# Patient Record
Sex: Female | Born: 1990 | Race: Black or African American | Hispanic: No | Marital: Single | Smoking: Never smoker
Health system: Southern US, Community
[De-identification: ages and names within clinical notes are randomized; demographics above are authoritative.]

## PROBLEM LIST (undated history)

## (undated) ENCOUNTER — Inpatient Hospital Stay (HOSPITAL_COMMUNITY): Payer: Self-pay

## (undated) DIAGNOSIS — A549 Gonococcal infection, unspecified: Secondary | ICD-10-CM

## (undated) DIAGNOSIS — F419 Anxiety disorder, unspecified: Secondary | ICD-10-CM

## (undated) DIAGNOSIS — J45909 Unspecified asthma, uncomplicated: Secondary | ICD-10-CM

## (undated) HISTORY — PX: DILATION AND CURETTAGE OF UTERUS: SHX78

---

## 2016-10-27 ENCOUNTER — Inpatient Hospital Stay (HOSPITAL_COMMUNITY)
Admission: AD | Admit: 2016-10-27 | Discharge: 2016-10-27 | Disposition: A | Discharge status: Home / Self Care | Payer: Self-pay | Source: Ambulatory Visit | Attending: Obstetrics & Gynecology | Admitting: Obstetrics & Gynecology

## 2016-10-27 ENCOUNTER — Inpatient Hospital Stay (HOSPITAL_COMMUNITY): Payer: Self-pay

## 2016-10-27 ENCOUNTER — Encounter (HOSPITAL_COMMUNITY): Payer: Self-pay | Admitting: *Deleted

## 2016-10-27 DIAGNOSIS — Z3A13 13 weeks gestation of pregnancy: Secondary | ICD-10-CM | POA: Insufficient documentation

## 2016-10-27 DIAGNOSIS — O26891 Other specified pregnancy related conditions, first trimester: Secondary | ICD-10-CM | POA: Insufficient documentation

## 2016-10-27 DIAGNOSIS — O209 Hemorrhage in early pregnancy, unspecified: Secondary | ICD-10-CM | POA: Insufficient documentation

## 2016-10-27 DIAGNOSIS — O4692 Antepartum hemorrhage, unspecified, second trimester: Secondary | ICD-10-CM

## 2016-10-27 DIAGNOSIS — O3680X0 Pregnancy with inconclusive fetal viability, not applicable or unspecified: Secondary | ICD-10-CM

## 2016-10-27 DIAGNOSIS — Z6790 Unspecified blood type, Rh positive: Secondary | ICD-10-CM | POA: Insufficient documentation

## 2016-10-27 DIAGNOSIS — O4690 Antepartum hemorrhage, unspecified, unspecified trimester: Secondary | ICD-10-CM

## 2016-10-27 HISTORY — DX: Unspecified asthma, uncomplicated: J45.909

## 2016-10-27 LAB — URINALYSIS, ROUTINE W REFLEX MICROSCOPIC
BILIRUBIN URINE: NEGATIVE
GLUCOSE, UA: NEGATIVE mg/dL
HGB URINE DIPSTICK: NEGATIVE
Ketones, ur: 15 mg/dL — AB
Leukocytes, UA: NEGATIVE
Nitrite: NEGATIVE
PROTEIN: NEGATIVE mg/dL
Specific Gravity, Urine: 1.02 (ref 1.005–1.030)
pH: 6.5 (ref 5.0–8.0)

## 2016-10-27 LAB — CBC
HCT: 25.1 % — ABNORMAL LOW (ref 36.0–46.0)
Hemoglobin: 9.1 g/dL — ABNORMAL LOW (ref 12.0–15.0)
MCH: 32.3 pg (ref 26.0–34.0)
MCHC: 36.3 g/dL — ABNORMAL HIGH (ref 30.0–36.0)
MCV: 89 fL (ref 78.0–100.0)
PLATELETS: 191 10*3/uL (ref 150–400)
RBC: 2.82 MIL/uL — AB (ref 3.87–5.11)
RDW: 12.6 % (ref 11.5–15.5)
WBC: 13.5 10*3/uL — AB (ref 4.0–10.5)

## 2016-10-27 LAB — POCT PREGNANCY, URINE: PREG TEST UR: POSITIVE — AB

## 2016-10-27 LAB — HCG, QUANTITATIVE, PREGNANCY: HCG, BETA CHAIN, QUANT, S: 1851 m[IU]/mL — AB (ref <5)

## 2016-10-27 LAB — ABO/RH: ABO/RH(D): O POS

## 2016-10-27 MED ORDER — MISOPROSTOL 200 MCG PO TABS
600.0000 ug | ORAL_TABLET | Freq: Once | ORAL | Status: AC
  Administered 2016-10-27: 600 ug via BUCCAL
  Filled 2016-10-27: qty 3

## 2016-10-27 MED ORDER — HYDROMORPHONE HCL 1 MG/ML IJ SOLN
INTRAMUSCULAR | Status: AC
  Filled 2016-10-27: qty 1

## 2016-10-27 MED ORDER — LACTATED RINGERS IV BOLUS (SEPSIS)
1000.0000 mL | Freq: Once | INTRAVENOUS | Status: AC
  Administered 2016-10-27: 1000 mL via INTRAVENOUS

## 2016-10-27 MED ORDER — PROMETHAZINE HCL 25 MG/ML IJ SOLN
25.0000 mg | Freq: Once | INTRAMUSCULAR | Status: AC
  Administered 2016-10-27: 25 mg via INTRAVENOUS
  Filled 2016-10-27: qty 1

## 2016-10-27 MED ORDER — HYDROMORPHONE HCL 1 MG/ML IJ SOLN
2.0000 mg | Freq: Once | INTRAMUSCULAR | Status: AC
  Administered 2016-10-27: 2 mg via INTRAVENOUS
  Filled 2016-10-27: qty 2

## 2016-10-27 MED ORDER — PROMETHAZINE HCL 25 MG PO TABS: 25.0000 mg | ORAL_TABLET | Freq: Four times a day (QID) | ORAL | 0 refills | Status: DC | PRN

## 2016-10-27 MED ORDER — OXYCODONE-ACETAMINOPHEN 5-325 MG PO TABS
1.0000 | ORAL_TABLET | Freq: Four times a day (QID) | ORAL | 0 refills | Status: DC | PRN
Start: 2016-10-27 — End: 2017-03-15

## 2016-10-27 MED ORDER — HYDROMORPHONE HCL 2 MG/ML IJ SOLN
2.0000 mg | Freq: Once | INTRAMUSCULAR | Status: AC
  Administered 2016-10-27: 2 mg via INTRAVENOUS
  Filled 2016-10-27: qty 1

## 2016-10-27 NOTE — MAU Note (Signed)
Urine in lab 

## 2016-10-27 NOTE — MAU Provider Note (Signed)
History     CSN: [id number]  Arrival date and time: 10/27/16 0725       Chief Complaint  Patient presents with  . Vaginal Bleeding   [id number] @13 .6 wks by LMP here with severe abdominal cramping and heavy VB. She reports mild pain and spotting started last night then became heavier early this am. She was passing some clots at home. She denies seeing any passage of tissue. She did not take analgesics. [facility] and [facility].    Vaginal Bleeding  The patient's primary symptoms include pelvic pain and vaginal bleeding. This is a new problem. The current episode started today. The problem occurs constantly. The problem has been gradually worsening. The pain is severe. The problem affects both (10/10 ) sides. She is pregnant. Associated symptoms include abdominal pain. Pertinent negatives include no chills, constipation, diarrhea, dysuria, fever, frequency, nausea, urgency or vomiting. The vaginal discharge was bloody. The vaginal bleeding is heavier than menses. She has been passing clots. Passing tissue: unsure  Nothing aggravates the symptoms. She has tried nothing for the symptoms.   No past medical history on file.  No past surgical history on file.  No family history on file.  Social History  Substance Use Topics  . Smoking status: Not on file  . Smokeless tobacco: Not on file  . Alcohol use Not on file    Allergies:  Allergies  Allergen Reactions  . Aspirin Swelling    No prescriptions prior to admission.    Review of Systems  Constitutional: Negative for chills and fever.  Gastrointestinal: Positive for abdominal pain. Negative for constipation, diarrhea, nausea and vomiting.  Genitourinary: Positive for pelvic pain and vaginal bleeding. Negative for dysuria, frequency and urgency.   Physical Exam   There were no vitals taken for this visit.  Physical Exam  Nursing note and vitals reviewed. Constitutional: She is oriented  to person, place, and time. She appears well-developed and well-nourished. She appears distressed.  Cardiovascular: Normal rate.   Respiratory: Effort normal.  GI: Soft. There is tenderness.  Genitourinary:  Genitourinary Comments:  External: no lesion Vagina: large amount of blood and clots in the vagina. No tissue seen  Cervix: no tissue seen  Uterus: Patient unable to tolerate bimanual exam. Will re-examine after US and pain medication    Neurological: She is alert and oriented to person, place, and time.  Skin: Skin is warm and dry.   [place] Ob Comp Less 14 Wks  Result Date: 10/27/2016 CLINICAL DATA:  Severe pelvic pain and cramping and heavy bleeding beginning yesterday. Gestational age by LMP of 13 weeks 6 days. EXAM: OBSTETRIC <14 WK ULTRASOUND TECHNIQUE: Transabdominal ultrasound was performed for evaluation of the gestation as well as the maternal uterus and adnexal regions. Transvaginal sonography was declined by the ordering clinician in MAU. COMPARISON:  None. FINDINGS: No normal appearing intrauterine gestational sac identified within the endometrial cavity by transvaginal sonography. Endometrial thickness measures 22 mm, and complex heterogeneous fluid is seen within the inferior aspect of the endometrial cavity and endocervical canal, likely representing blood products. Both ovaries are normal in appearance. No adnexal mass or free fluid identified. IMPRESSION: No IUP visualized, with blood products within the endometrial cavity and endocervical canal, suspicious for recent spontaneous abortion. IUP too early to visualize and occult ectopic pregnancy remain in the differential diagnosis. Recommend continued followup of quantitative beta HCG levels, with follow-up ultrasound as clinically warranted. Electronically Signed   By:  Myles RosenthalJohn  Stahl M.D.   On: 10/27/2016 08:44   Results for orders placed or performed during the hospital encounter of 10/27/16 (from the past 24 hour(s))  Urinalysis,  Routine w reflex microscopic (not at [facility])     Status: Abnormal   Collection Time: 10/27/16  8:54 AM  Result Value Ref Range   Color, Urine YELLOW YELLOW   APPearance CLEAR CLEAR   Specific Gravity, Urine 1.020 1.005 - 1.030   pH 6.5 5.0 - 8.0   Glucose, UA NEGATIVE NEGATIVE mg/dL   Hgb urine dipstick NEGATIVE NEGATIVE   Bilirubin Urine NEGATIVE NEGATIVE   Ketones, ur 15 (A) NEGATIVE mg/dL   Protein, ur NEGATIVE NEGATIVE mg/dL   Nitrite NEGATIVE NEGATIVE   Leukocytes, UA NEGATIVE NEGATIVE  Pregnancy, urine POC     Status: Abnormal   Collection Time: 10/27/16  8:55 AM  Result Value Ref Range   Preg Test, Ur POSITIVE (A) NEGATIVE  CBC     Status: Abnormal   Collection Time: 10/27/16  9:01 AM  Result Value Ref Range   WBC 13.5 (H) 4.0 - 10.5 K/uL   RBC 2.82 (L) 3.87 - 5.11 MIL/uL   Hemoglobin 9.1 (L) 12.0 - 15.0 g/dL   HCT 96.025.1 (L) 45.436.0 - 09.846.0 %   MCV 89.0 78.0 - 100.0 fL   MCH 32.3 26.0 - 34.0 pg   MCHC 36.3 (H) 30.0 - 36.0 g/dL   RDW 11.912.6 14.711.5 - 82.915.5 %   Platelets 191 150 - 400 K/uL  hCG, quantitative, pregnancy     Status: Abnormal   Collection Time: 10/27/16  9:01 AM  Result Value Ref Range   hCG, Beta Chain, Quant, S 1,851 (H) <5 mIU/mL  ABO/Rh     Status: None (Preliminary result)   Collection Time: 10/27/16  9:01 AM  Result Value Ref Range   ABO/RH(D) O POS     MAU Course  Procedures Dilaudid 2 mg IV x2 doses Phenergan 25 mg IV x1  MDM CBC. HCG, ABO/RH, US pending  [facility] Loure[facility], CNM Tawnya CrookHogan, Heather Donovan 8:11 AM  10/27/16  0900--bleeding moderate with quarter sized clots, pain improved, Misoprostil ordered 1035-pt reports worsening pain, RN concerned about VB Speculum exam: small-mod amt of blood in vault with small clots, small amt of blood from os, pain meds given 1400-Pain and nausea improved, tolerating po, bleeding small. Stable for discharge home.  Likely first trimester SAB but cannot exclude ectopic therefore will have her  return in 2 days for rpt quant, if declining will follow weekly quants until negative. Discussed presentation, clinical findings, and plan with Dr. Alysia PennaErvin, agrees with plan.    Assessment and Plan   1. Pregnancy of unknown anatomic location   2. Vaginal bleeding in pregnancy   3. Blood type, Rh positive    Discharge home Follow up in MAU in 2 days for rpt quant Ectopic precautions  Donette LarryMelanie Hargis Vandyne, CNM  10/27/2016 2:35 PM

## 2016-10-27 NOTE — MAU Note (Signed)
Brought in by EMS with heavy bleeding with large clots and sever abdominal cramping; bleeding and cramping started last night and got progressively worse during the night;

## 2016-10-27 NOTE — Discharge Instructions (Signed)
Miscarriage  A miscarriage is the sudden loss of an unborn baby (fetus) before the 20th week of pregnancy. Most miscarriages happen in the first 3 months of pregnancy. Sometimes, it happens before a woman even knows she is pregnant. A miscarriage is also called a "spontaneous miscarriage" or "early pregnancy loss." Having a miscarriage can be an emotional experience. Talk with your caregiver about any questions you may have about miscarrying, the grieving process, and your future pregnancy plans.  CAUSES    Problems with the fetal chromosomes that make it impossible for the baby to develop normally. Problems with the baby's genes or chromosomes are most often the result of errors that occur, by chance, as the embryo divides and grows. The problems are not inherited from the parents.   Infection of the cervix or uterus.    Hormone problems.    Problems with the cervix, such as having an incompetent cervix. This is when the tissue in the cervix is not strong enough to hold the pregnancy.    Problems with the uterus, such as an abnormally shaped uterus, uterine fibroids, or congenital abnormalities.    Certain medical conditions.    Smoking, drinking alcohol, or taking illegal drugs.    Trauma.   Often, the cause of a miscarriage is unknown.   SYMPTOMS    Vaginal bleeding or spotting, with or without cramps or pain.   Pain or cramping in the abdomen or lower back.   Passing fluid, tissue, or blood clots from the vagina.  DIAGNOSIS   Your caregiver will perform a physical exam. You may also have an ultrasound to confirm the miscarriage. Blood or urine tests may also be ordered.  TREATMENT    Sometimes, treatment is not necessary if you naturally pass all the fetal tissue that was in the uterus. If some of the fetus or placenta remains in the body (incomplete miscarriage), tissue left behind may become infected and must be removed. Usually, a dilation and curettage (D and C) procedure is performed.  During a D and C procedure, the cervix is widened (dilated) and any remaining fetal or placental tissue is gently removed from the uterus.   Antibiotic medicines are prescribed if there is an infection. Other medicines may be given to reduce the size of the uterus (contract) if there is a lot of bleeding.   If you have Rh negative blood and your baby was Rh positive, you will need a Rh immunoglobulin shot. This shot will protect any future baby from having Rh blood problems in future pregnancies.  HOME CARE INSTRUCTIONS    Your caregiver may order bed rest or may allow you to continue light activity. Resume activity as directed by your caregiver.   Have someone help with home and family responsibilities during this time.    Keep track of the number of sanitary pads you use each day and how soaked (saturated) they are. Write down this information.    Do not use tampons. Do not douche or have sexual intercourse until approved by your caregiver.    Only take over-the-counter or prescription medicines for pain or discomfort as directed by your caregiver.    Do not take aspirin. Aspirin can cause bleeding.    Keep all follow-up appointments with your caregiver.    If you or your partner have problems with grieving, talk to your caregiver or seek counseling to help cope with the pregnancy loss. Allow enough time to grieve before trying to get pregnant again.     SEEK IMMEDIATE MEDICAL CARE IF:    You have severe cramps or pain in your back or abdomen.   You have a fever.   You pass large blood clots (walnut-sized or larger) ortissue from your vagina. Save any tissue for your caregiver to inspect.    Your bleeding increases.    You have a thick, bad-smelling vaginal discharge.   You become lightheaded, weak, or you faint.    You have chills.   MAKE SURE YOU:   Understand these instructions.   Will watch your condition.   Will get help right away if you are not doing well or get worse.     This  information is not intended to replace advice given to you by your health care provider. Make sure you discuss any questions you have with your health care provider.     Document Released: 06/07/2001 Document Revised: 04/08/2013 Document Reviewed: 01/31/2012  Elsevier Interactive Patient Education 2016 [employer]

## 2017-03-15 ENCOUNTER — Encounter (HOSPITAL_COMMUNITY): Payer: Self-pay | Admitting: *Deleted

## 2017-03-15 ENCOUNTER — Inpatient Hospital Stay (HOSPITAL_COMMUNITY)
Admission: AD | Admit: 2017-03-15 | Discharge: 2017-03-15 | Disposition: A | Discharge status: Home / Self Care | Payer: Self-pay | Source: Ambulatory Visit | Attending: Obstetrics and Gynecology | Admitting: Obstetrics and Gynecology

## 2017-03-15 ENCOUNTER — Inpatient Hospital Stay (HOSPITAL_COMMUNITY): Payer: Self-pay

## 2017-03-15 DIAGNOSIS — J45909 Unspecified asthma, uncomplicated: Secondary | ICD-10-CM | POA: Insufficient documentation

## 2017-03-15 DIAGNOSIS — O99331 Smoking (tobacco) complicating pregnancy, first trimester: Secondary | ICD-10-CM | POA: Insufficient documentation

## 2017-03-15 DIAGNOSIS — O021 Missed abortion: Secondary | ICD-10-CM | POA: Insufficient documentation

## 2017-03-15 DIAGNOSIS — Z888 Allergy status to other drugs, medicaments and biological substances status: Secondary | ICD-10-CM | POA: Insufficient documentation

## 2017-03-15 DIAGNOSIS — Z3A11 11 weeks gestation of pregnancy: Secondary | ICD-10-CM | POA: Insufficient documentation

## 2017-03-15 DIAGNOSIS — Z79899 Other long term (current) drug therapy: Secondary | ICD-10-CM | POA: Insufficient documentation

## 2017-03-15 DIAGNOSIS — F17210 Nicotine dependence, cigarettes, uncomplicated: Secondary | ICD-10-CM | POA: Insufficient documentation

## 2017-03-15 DIAGNOSIS — O209 Hemorrhage in early pregnancy, unspecified: Secondary | ICD-10-CM

## 2017-03-15 DIAGNOSIS — O99511 Diseases of the respiratory system complicating pregnancy, first trimester: Secondary | ICD-10-CM | POA: Insufficient documentation

## 2017-03-15 LAB — WET PREP, GENITAL
Clue Cells Wet Prep HPF POC: NONE SEEN
SPERM: NONE SEEN
TRICH WET PREP: NONE SEEN
Yeast Wet Prep HPF POC: NONE SEEN

## 2017-03-15 LAB — URINALYSIS, ROUTINE W REFLEX MICROSCOPIC
BILIRUBIN URINE: NEGATIVE
Bacteria, UA: NONE SEEN
GLUCOSE, UA: NEGATIVE mg/dL
KETONES UR: NEGATIVE mg/dL
LEUKOCYTES UA: NEGATIVE
NITRITE: NEGATIVE
PH: 9 — AB (ref 5.0–8.0)
Protein, ur: NEGATIVE mg/dL
SPECIFIC GRAVITY, URINE: 1.018 (ref 1.005–1.030)
WBC, UA: NONE SEEN WBC/hpf (ref 0–5)

## 2017-03-15 LAB — POCT PREGNANCY, URINE: Preg Test, Ur: POSITIVE — AB

## 2017-03-15 LAB — CBC
HCT: 33.9 % — ABNORMAL LOW (ref 36.0–46.0)
Hemoglobin: 11.8 g/dL — ABNORMAL LOW (ref 12.0–15.0)
MCH: 31 pg (ref 26.0–34.0)
MCHC: 34.8 g/dL (ref 30.0–36.0)
MCV: 89 fL (ref 78.0–100.0)
PLATELETS: 290 10*3/uL (ref 150–400)
RBC: 3.81 MIL/uL — AB (ref 3.87–5.11)
RDW: 13.1 % (ref 11.5–15.5)
WBC: 4.8 10*3/uL (ref 4.0–10.5)

## 2017-03-15 LAB — HCG, QUANTITATIVE, PREGNANCY: hCG, Beta Chain, Quant, S: 7917 m[IU]/mL — ABNORMAL HIGH (ref <5)

## 2017-03-15 MED ORDER — PROMETHAZINE HCL 12.5 MG PO TABS: 12.5000 mg | ORAL_TABLET | Freq: Four times a day (QID) | ORAL | 0 refills | Status: DC | PRN

## 2017-03-15 MED ORDER — MISOPROSTOL 200 MCG PO TABS: 600.0000 ug | ORAL_TABLET | Freq: Once | ORAL | 1 refills | Status: DC

## 2017-03-15 MED ORDER — OXYCODONE-ACETAMINOPHEN 5-325 MG PO TABS: 1.0000 | ORAL_TABLET | ORAL | 0 refills | Status: DC | PRN

## 2017-03-15 NOTE — Discharge Instructions (Signed)
FACTS YOU SHOULD KNOW  WHAT IS AN EARLY PREGNANCY FAILURE? Once the egg is fertilized with the sperm and begins to develop, it attaches to the lining of the uterus. This early pregnancy tissue may not develop into an embryo (the beginning stage of a baby). Sometimes an embryo does develop but does not continue to grow. These problems can be seen on ultrasound.   MANAGEMNT OF EARLY PREGNANCY FAILURE: About 4 out of 100 (0.25%) women will have a pregnancy loss in her lifetime.  One in five pregnancies is found to be an early pregnancy failure.  There are 3 ways to care for an early pregnancy failure:   (1) Surgery, (2) Medicine, (3) Waiting for you to pass the pregnancy on your own. The decision as to how to proceed after being diagnosed with and early pregnancy failure is an individual one.  The decision can be made only after appropriate counseling.  You need to weigh the pros and cons of the 3 choices. Then you can make the choice that works for you. SURGERY (D&E) . Procedure over in 1 day . Requires being put to sleep . Bleeding may be light . Possible problems during surgery, including injury to womb(uterus) . Care provider has more control Medicine (CYTOTEC) . The complete procedure may take days to weeks . No Surgery . Bleeding may be heavy at times . There may be drug side effects . Patient has more control Waiting . You may choose to wait, in which case your own body may complete the passing of the abnormal early pregnancy on its own in about 2-4 weeks . Your bleeding may be heavy at times . There is a small possibility that you may need surgery if the bleeding is too much or not all of the pregnancy has passed. CYTOTEC MANAGEMENT Prostaglandins (cytotec) are the most widely used drug for this purpose. They cause the uterus to cramp and contract. You will place the medicine yourself inside your vagina in the privacy of your home. Empting of the uterus should occur within 3 days but  the process may continue for several weeks. The bleeding may seem heavy at times. POSSIBLE SIDE EFFECTS FROM CYTOTEC . Nausea   Vomiting . Diarrhea Fever . Chills  Hot Flashes Side effects  from the process of the early pregnancy failure include: . Cramping  Bleeding . Headaches  Dizziness RISKS: This is a low risk procedure. Less than 1 in 100 women has a complication. An incomplete passage of the early pregnancy may occur. Also, Hemorrhage (heavy bleeding) could happen.  Rarely the pregnancy will not be passed completely. Excessively heavy bleeding may occur.  Your doctor may need to perform surgery to empty the uterus (D&E). Afterwards: Everybody will feel differently after the early pregnancy completion. You may have soreness or cramps for a day or two. You may have soreness or cramps for day or two.  You may have light bleeding for up to 2 weeks. You may be as active as you feel like being. If you have any of the following problems you may call Maternity Admissions Unit at [phone]. . If you have pain that does not get better  with pain medication . Bleeding that soaks through 2 thick full-sized sanitary pads in an hour . Cramps that last longer than 2 days . Foul smelling discharge . Fever above 100.4 degrees F Even if you do not have any of these symptoms, you should have a follow-up exam to make sure you   are healing properly. This appointment will be made for you before you leave the hospital. Your next normal period will start again in 4-6 week after the loss. You can get pregnant soon after the loss, so use birth control right away. Finally: Make sure all your questions are answered before during and after any procedure. Follow up with medical care and family planning methods.     Incomplete Miscarriage A miscarriage is the sudden loss of an unborn baby (fetus) before the 20th week of pregnancy. In an incomplete miscarriage, parts of the fetus or placenta (afterbirth) remain in  the body. Having a miscarriage can be an emotional experience. Talk with your health care provider about any questions you may have about miscarrying, the grieving process, and your future pregnancy plans. What are the causes?  Problems with the fetal chromosomes that make it impossible for the baby to develop normally. Problems with the baby's genes or chromosomes are most often the result of errors that occur by chance as the embryo divides and grows. The problems are not inherited from the parents.  Infection of the cervix or uterus.  Hormone problems.  Problems with the cervix, such as having an incompetent cervix. This is when the tissue in the cervix is not strong enough to hold the pregnancy.  Problems with the uterus, such as an abnormally shaped uterus, uterine fibroids, or congenital abnormalities.  Certain medical conditions.  Smoking, drinking alcohol, or taking illegal drugs.  Trauma. What are the signs or symptoms?  Vaginal bleeding or spotting, with or without cramps or pain.  Pain or cramping in the abdomen or lower back.  Passing fluid, tissue, or blood clots from the vagina. How is this diagnosed? Your health care provider will perform a physical exam. You may also have an ultrasound to confirm the miscarriage. Blood or urine tests may also be ordered. How is this treated?  Usually, a dilation and curettage (D&C) procedure is performed. During a D&C procedure, the cervix is widened (dilated) and any remaining fetal or placental tissue is gently removed from the uterus.  Antibiotic medicines are prescribed if there is an infection. Other medicines may be given to reduce the size of the uterus (contract) if there is a lot of bleeding.  If you have Rh negative blood and your baby was Rh positive, you will need a Rho (D) immune globulin shot. This shot will protect any future baby from having Rh blood problems in future pregnancies.  You may be confined to bed rest.  This means you should stay in bed and only get up to use the bathroom. Follow these instructions at home:  Rest as directed by your health care provider.  Restrict activity as directed by your health care provider. You may be allowed to continue light activity if curettage was not done but you require further treatment.  Keep track of the number of pads you use each day. Keep track of how soaked (saturated) they are. Record this information.  Do not  use tampons.  Do not douche or have sexual intercourse until approved by your health care provider.  Keep all follow-up appointments for reevaluation and continuing management.  Only take over-the-counter or prescription medicines for pain, fever, or discomfort as directed by your health care provider.  Take antibiotic medicine as directed by your health care provider. Make sure you finish it even if you start to feel better. Get help right away if:  You experience severe cramps in your stomach, back,  or abdomen.  You have an unexplained temperature (make sure to record these temperatures).  You pass large clots or tissue (save these for your health care provider to inspect).  Your bleeding increases.  You become light-headed, weak, or have fainting episodes. This information is not intended to replace advice given to you by your health care provider. Make sure you discuss any questions you have with your health care provider. Document Released: 12/12/2005 Document Revised: 05/19/2016 Document Reviewed: 07/11/2013 Elsevier Interactive Patient Education  2017 [employer].

## 2017-03-15 NOTE — MAU Provider Note (Signed)
History     CSN: [id number]  Arrival date and time: 03/15/17 1712   First Provider Initiated Contact with Patient 03/15/17 1738      Chief Complaint  Patient presents with  . Vaginal Bleeding  . Abdominal Pain   Patient is a 26 year old G4 P0 at 11 weeks and 0 days who presents with concerns for miscarriage. She reports yesterday she had cramping which was 7/10 in nature. Today she reports the cramping is significantly improved to 4 out of 10 but she started with some vaginal bleeding. She had no other concerns or complaints at this time. She reports that she did recently have a yeast infection and UTI which was treated. She reports no abnormal vaginal discharge leading up to her cramping and bleeding. He denies fevers chills.    OB History    Gravida Para Term Preterm AB Living   4       3     SAB TAB Ectopic Multiple Live Births   1 2            Past Medical History:  Diagnosis Date  . Asthma     Past Surgical History:  Procedure Laterality Date  . INDUCED ABORTION      Family History  Problem Relation Age of Onset  . Alcohol abuse Neg Hx   . Arthritis Neg Hx   . Asthma Neg Hx   . Birth defects Neg Hx   . Cancer Neg Hx   . COPD Neg Hx   . Depression Neg Hx   . Diabetes Neg Hx   . Drug abuse Neg Hx   . Early death Neg Hx   . Hearing loss Neg Hx   . Heart disease Neg Hx   . Hyperlipidemia Neg Hx   . Hypertension Neg Hx   . Kidney disease Neg Hx   . Learning disabilities Neg Hx   . Mental illness Neg Hx   . Mental retardation Neg Hx   . Miscarriages / Stillbirths Neg Hx   . Stroke Neg Hx   . Vision loss Neg Hx   . Varicose Veins Neg Hx     Social History  Substance Use Topics  . Smoking status: Current Some Day Smoker    Packs/day: 0.25  . Smokeless tobacco: Current User  . Alcohol use No    Allergies:  Allergies  Allergen Reactions  . Aspirin Swelling and Other (See Comments)    Reaction:  Tongue swelling/blurry vision     Prescriptions  Prior to Admission  Medication Sig Dispense Refill Last Dose  . albuterol (PROVENTIL HFA;VENTOLIN HFA) 108 (90 Base) MCG/ACT inhaler Inhale 2 puffs into the lungs every 6 (six) hours as needed for wheezing or shortness of breath.   Rescue  . folic acid (FOLVITE) 400 MCG tablet Take 400 mcg by mouth daily.   03/14/2017 at Unknown time  . Prenatal Vit-Fe Fumarate-FA (PRENATAL MULTIVITAMIN) TABS tablet Take 1 tablet by mouth every evening.    03/14/2017 at Unknown time  . oxyCODONE-acetaminophen (ROXICET) 5-325 MG tablet Take 1-2 tablets by mouth every 6 (six) hours as needed for severe pain. (Patient not taking: Reported on 03/15/2017) 6 tablet 0 Not Taking at Unknown time  . promethazine (PHENERGAN) 25 MG tablet Take 1 tablet (25 mg total) by mouth every 6 (six) hours as needed for nausea or vomiting. (Patient not taking: Reported on 03/15/2017) 30 tablet 0 Not Taking at Unknown time    Review of Systems  Constitutional: Negative  for chills and fever.  HENT: Negative for congestion and rhinorrhea.   Respiratory: Negative for chest tightness and shortness of breath.   Cardiovascular: Negative for chest pain and palpitations.  Gastrointestinal: Positive for abdominal pain. Negative for abdominal distention, constipation, diarrhea, nausea and vomiting.  Genitourinary: Negative for difficulty urinating, dysuria, flank pain and frequency.  Musculoskeletal: Negative for arthralgias and back pain.  Neurological: Negative for dizziness and weakness.   Physical Exam   Blood pressure 106/74, pulse 75, temperature 98.9 F (37.2 C), temperature source Oral, resp. rate 18, height 5\' 2"  (1.575 m), weight 125 lb (56.7 kg), last menstrual period 12/28/2016, unknown if currently breastfeeding.  Physical Exam  Constitutional: She is oriented to person, place, and time. She appears well-developed and well-nourished.  HENT:  Head: Normocephalic and atraumatic.  Neck: Normal range of motion. Neck supple. No  thyromegaly present.  Cardiovascular: Normal rate and intact distal pulses.   Respiratory: Effort normal. No respiratory distress.  GI: Soft. Bowel sounds are normal. She exhibits no distension. There is no tenderness. There is no rebound and no guarding.  Genitourinary:  Genitourinary Comments: Vagina with small amount of dark blood in vaginal vault, small amount of bright red blood coming from the cervical os. No cervical motion tenderness., Cervix appears closed.  Musculoskeletal: Normal range of motion. She exhibits no edema.  Neurological: She is alert and oriented to person, place, and time.    MAU Course  Procedures  MDM In MAU patient underwent ultrasound which revealed a missed AB. A 7 wk pregnancy with embryo and no fetal heart rate is noted.   Patient is RH positive  Cbc is wnl  Patient given options of Cytotec, versus extpectant management versus surgery is given.   Patient opts for cytotec.  Early Intrauterine Pregnancy Failure Protocol X  Documented intrauterine pregnancy failure less than or equal to [redacted] weeks   gestation  X  No serious current illness  X  Baseline Hgb greater than or equal to 10g/dl  X  Patient has easily accessible transportation to the hospital  X  Clear preference  X  Practitioner/physician deems patient reliable  X  Counseling by practitioner or physician  X  Patient education by RN       Rho-Gam given by RN if indicated  X  Cytotec 600 mcg Buccal by patient at home       Intravaginally by NP in MAU       Rectally by patient at home       Rectally by RN in MAU  X   Percocet 5mg  dispence 5 X   Phenergan 12.5 mg by mouth every 4 hours as needed for nausea - prescribed  Reviewed with pt cytotec procedure.  Pt verbalizes that she lives close to the hospital and has transportation readily available.  Pt appears reliable and verbalizes understanding and agrees with plan of care  Assessment and Plan  #1. Missed AB: cytotec prescription given to  patient. Along with phenergan and percocet. Given follow up in office. Instructed to return if after 2 doses of cytotec no significant bleeding or passage of tissue.   Ernestina Pennaicholas Katelyne Galster 03/15/2017, 6:09 PM

## 2017-03-15 NOTE — MAU Note (Addendum)
Pt came in by EMS, started bleeding this morning @ 0900, began having cramping, took benadryl for pain.   Bleeding is brown/red.  Pos HPT on Feb 2.

## 2017-03-16 LAB — GC/CHLAMYDIA PROBE AMP (~~LOC~~) NOT AT ARMC
CHLAMYDIA, DNA PROBE: NEGATIVE
Neisseria Gonorrhea: NEGATIVE

## 2017-10-22 ENCOUNTER — Encounter (HOSPITAL_COMMUNITY): Payer: Self-pay | Admitting: *Deleted

## 2017-10-22 ENCOUNTER — Inpatient Hospital Stay (HOSPITAL_COMMUNITY)
Admission: AD | Admit: 2017-10-22 | Discharge: 2017-10-22 | Disposition: A | Discharge status: Home / Self Care | Payer: Medicaid Other | Source: Ambulatory Visit | Attending: Obstetrics and Gynecology | Admitting: Obstetrics and Gynecology

## 2017-10-22 ENCOUNTER — Inpatient Hospital Stay (HOSPITAL_COMMUNITY): Payer: Medicaid Other

## 2017-10-22 ENCOUNTER — Other Ambulatory Visit: Payer: Self-pay | Admitting: Advanced Practice Midwife

## 2017-10-22 DIAGNOSIS — N760 Acute vaginitis: Secondary | ICD-10-CM

## 2017-10-22 DIAGNOSIS — Z3491 Encounter for supervision of normal pregnancy, unspecified, first trimester: Secondary | ICD-10-CM

## 2017-10-22 DIAGNOSIS — J45909 Unspecified asthma, uncomplicated: Secondary | ICD-10-CM | POA: Insufficient documentation

## 2017-10-22 DIAGNOSIS — B9689 Other specified bacterial agents as the cause of diseases classified elsewhere: Secondary | ICD-10-CM

## 2017-10-22 DIAGNOSIS — O26891 Other specified pregnancy related conditions, first trimester: Secondary | ICD-10-CM | POA: Diagnosis not present

## 2017-10-22 DIAGNOSIS — Z3A01 Less than 8 weeks gestation of pregnancy: Secondary | ICD-10-CM | POA: Insufficient documentation

## 2017-10-22 DIAGNOSIS — R102 Pelvic and perineal pain: Secondary | ICD-10-CM

## 2017-10-22 DIAGNOSIS — Z79899 Other long term (current) drug therapy: Secondary | ICD-10-CM | POA: Insufficient documentation

## 2017-10-22 DIAGNOSIS — O23591 Infection of other part of genital tract in pregnancy, first trimester: Secondary | ICD-10-CM | POA: Diagnosis not present

## 2017-10-22 DIAGNOSIS — O99511 Diseases of the respiratory system complicating pregnancy, first trimester: Secondary | ICD-10-CM | POA: Insufficient documentation

## 2017-10-22 DIAGNOSIS — O26899 Other specified pregnancy related conditions, unspecified trimester: Secondary | ICD-10-CM

## 2017-10-22 LAB — CBC
HCT: 35.6 % — ABNORMAL LOW (ref 36.0–46.0)
Hemoglobin: 12.7 g/dL (ref 12.0–15.0)
MCH: 31.7 pg (ref 26.0–34.0)
MCHC: 35.7 g/dL (ref 30.0–36.0)
MCV: 88.8 fL (ref 78.0–100.0)
PLATELETS: 251 10*3/uL (ref 150–400)
RBC: 4.01 MIL/uL (ref 3.87–5.11)
RDW: 11.9 % (ref 11.5–15.5)
WBC: 7.3 10*3/uL (ref 4.0–10.5)

## 2017-10-22 LAB — URINALYSIS, ROUTINE W REFLEX MICROSCOPIC
Bilirubin Urine: NEGATIVE
GLUCOSE, UA: NEGATIVE mg/dL
Hgb urine dipstick: NEGATIVE
KETONES UR: 20 mg/dL — AB
LEUKOCYTES UA: NEGATIVE
NITRITE: NEGATIVE
PROTEIN: NEGATIVE mg/dL
Specific Gravity, Urine: 1.027 (ref 1.005–1.030)
pH: 6 (ref 5.0–8.0)

## 2017-10-22 LAB — HCG, QUANTITATIVE, PREGNANCY: hCG, Beta Chain, Quant, S: 77021 m[IU]/mL — ABNORMAL HIGH (ref <5)

## 2017-10-22 LAB — WET PREP, GENITAL
Sperm: NONE SEEN
TRICH WET PREP: NONE SEEN
Yeast Wet Prep HPF POC: NONE SEEN

## 2017-10-22 MED ORDER — PRENATAL MULTIVITAMIN CH: 1.0000 | ORAL_TABLET | Freq: Every evening | ORAL | 12 refills | Status: DC

## 2017-10-22 MED ORDER — METRONIDAZOLE 500 MG PO TABS: 500.0000 mg | ORAL_TABLET | Freq: Two times a day (BID) | ORAL | 0 refills | Status: DC

## 2017-10-22 NOTE — Discharge Instructions (Signed)
Bacterial Vaginosis Bacterial vaginosis is an infection of the vagina. It happens when too many germs (bacteria) grow in the vagina. This infection puts you at risk for infections from sex (STIs). Treating this infection can lower your risk for some STIs. You should also treat this if you are pregnant. It can cause your baby to be born early. Follow these instructions at home: Medicines  Take over-the-counter and prescription medicines only as told by your doctor.  Take or use your antibiotic medicine as told by your doctor. Do not stop taking or using it even if you start to feel better. General instructions  If you your sexual partner is a woman, tell her that you have this infection. She needs to get treatment if she has symptoms. If you have a female partner, he does not need to be treated.  During treatment: ? Avoid sex. ? Do not douche. ? Avoid alcohol as told. ? Avoid breastfeeding as told.  Drink enough fluid to keep your pee (urine) clear or pale yellow.  Keep your vagina and butt (rectum) clean. ? Wash the area with warm water every day. ? Wipe from front to back after you use the toilet.  Keep all follow-up visits as told by your doctor. This is important. Preventing this condition  Do not douche.  Use only warm water to wash around your vagina.  Use protection when you have sex. This includes: ? Latex condoms. ? Dental dams.  Limit how many people you have sex with. It is best to only have sex with the same person (be monogamous).  Get tested for STIs. Have your partner get tested.  Wear underwear that is cotton or lined with cotton.  Avoid tight pants and pantyhose. This is most important in summer.  Do not use any products that have nicotine or tobacco in them. These include cigarettes and e-cigarettes. If you need help quitting, ask your doctor.  Do not use illegal drugs.  Limit how much alcohol you drink. Contact a doctor if:  Your symptoms do not get  better, even after you are treated.  You have more discharge or pain when you pee (urinate).  You have a fever.  You have pain in your belly (abdomen).  You have pain with sex.  Your bleed from your vagina between periods. Summary  This infection happens when too many germs (bacteria) grow in the vagina.  Treating this condition can lower your risk for some infections from sex (STIs).  You should also treat this if you are pregnant. It can cause early (premature) birth.  Do not stop taking or using your antibiotic medicine even if you start to feel better. This information is not intended to replace advice given to you by your health care provider. Make sure you discuss any questions you have with your health care provider. Document Released: 09/20/2008 Document Revised: 08/27/2016 Document Reviewed: 08/27/2016 Elsevier Interactive Patient Education  2017 [employer] Abdominal Pain During Pregnancy Abdominal pain is common in pregnancy. Most of the time, it does not cause harm. There are many causes of abdominal pain. Some causes are more serious than others and sometimes the cause is not known. Abdominal pain can be a sign that something is very wrong with the pregnancy or the pain may have nothing to do with the pregnancy. Always tell your health care provider if you have any abdominal pain. Follow these instructions at home:  Do not have sex or put anything in your vagina until your  symptoms go away completely.  Watch your abdominal pain for any changes.  Get plenty of rest until your pain improves.  Drink enough fluid to keep your urine clear or pale yellow.  Take over-the-counter or prescription medicines only as told by your health care provider.  Keep all follow-up visits as told by your health care provider. This is important. Contact a health care provider if:  You have a fever.  Your pain gets worse or you have cramping.  Your pain continues after  resting. Get help right away if:  You are bleeding, leaking fluid, or passing tissue from the vagina.  You have vomiting or diarrhea that does not go away.  You have painful or bloody urination.  You notice a decrease in your baby's movements.  You feel very weak or faint.  You have shortness of breath.  You develop a severe headache with abdominal pain.  You have abnormal vaginal discharge with abdominal pain. This information is not intended to replace advice given to you by your health care provider. Make sure you discuss any questions you have with your health care provider. Document Released: 12/12/2005 Document Revised: 09/22/2016 Document Reviewed: 07/11/2013 Elsevier Interactive Patient Education  2018 [employer].   How a Baby Grows During Pregnancy Pregnancy begins when a female's sperm enters a female's egg (fertilization). This happens in one of the tubes (fallopian tubes) that connect the ovaries to the womb (uterus). The fertilized egg is called an embryo until it reaches 10 weeks. From 10 weeks until birth, it is called a fetus. The fertilized egg moves down the fallopian tube to the uterus. Then it implants into the lining of the uterus and begins to grow. The developing fetus receives oxygen and nutrients through the pregnant woman's bloodstream and the tissues that grow (placenta) to support the fetus. The placenta is the life support system for the fetus. It provides nutrition and removes waste. Learning as much as you can about your pregnancy and how your baby is developing can help you enjoy the experience. It can also make you aware of when there might be a problem and when to ask questions. How long does a typical pregnancy last? A pregnancy usually lasts 280 days, or about 40 weeks. Pregnancy is divided into three trimesters:  First trimester: 0-13 weeks.  Second trimester: 14-27 weeks.  Third trimester: 28-40 weeks.  The day when your baby is considered  ready to be born (full term) is your estimated date of delivery. How does my baby develop month by month? First month  The fertilized egg attaches to the inside of the uterus.  Some cells will form the placenta. Others will form the fetus.  The arms, legs, brain, spinal cord, lungs, and heart begin to develop.  At the end of the first month, the heart begins to beat.  Second month  The bones, inner ear, eyelids, hands, and feet form.  The genitals develop.  By the end of 8 weeks, all major organs are developing.  Third month  All of the internal organs are forming.  Teeth develop below the gums.  Bones and muscles begin to grow. The spine can flex.  The skin is transparent.  Fingernails and toenails begin to form.  Arms and legs continue to grow longer, and hands and feet develop.  The fetus is about 3 in (7.6 cm) long.  Fourth month  The placenta is completely formed.  The external sex organs, neck, outer ear, eyebrows, eyelids, and fingernails  are formed.  The fetus can hear, swallow, and move its arms and legs.  The kidneys begin to produce urine.  The skin is covered with a white waxy coating (vernix) and very fine hair (lanugo).  Fifth month  The fetus moves around more and can be felt for the first time (quickening).  The fetus starts to sleep and wake up and may begin to suck its finger.  The nails grow to the end of the fingers.  The organ in the digestive system that makes bile (gallbladder) functions and helps to digest the nutrients.  If your baby is a girl, eggs are present in her ovaries. If your baby is a boy, testicles start to move down into his scrotum.  Sixth month  The lungs are formed, but the fetus is not yet able to breathe.  The eyes open. The brain continues to develop.  Your baby has fingerprints and toe prints. Your baby's hair grows thicker.  At the end of the second trimester, the fetus is about 9 in (22.9 cm)  long.  Seventh month  The fetus kicks and stretches.  The eyes are developed enough to sense changes in light.  The hands can make a grasping motion.  The fetus responds to sound.  Eighth month  All organs and body systems are fully developed and functioning.  Bones harden and taste buds develop. The fetus may hiccup.  Certain areas of the brain are still developing. The skull remains soft.  Ninth month  The fetus gains about  lb (0.23 kg) each week.  The lungs are fully developed.  Patterns of sleep develop.  The fetus's head typically moves into a head-down position (vertex) in the uterus to prepare for birth. If the buttocks move into a vertex position instead, the baby is breech.  The fetus weighs 6-9 lbs (2.72-4.08 kg) and is 19-20 in (48.26-50.8 cm) long.  What can I do to have a healthy pregnancy and help my baby develop? Eating and Drinking  Eat a healthy diet. ? Talk with your health care provider to make sure that you are getting the nutrients that you and your baby need. ? Visit www.[employer].bechoosemyplate.gov to learn about creating a healthy diet.  Gain a healthy amount of weight during pregnancy as advised by your health care provider. This is usually 25-35 pounds. You may need to: ? Gain more if you were underweight before getting pregnant or if you are pregnant with more than one baby. ? Gain less if you were overweight or obese when you got pregnant.  Medicines and Vitamins  Take prenatal vitamins as directed by your health care provider. These include vitamins such as folic acid, iron, calcium, and vitamin D. They are important for healthy development.  Take medicines only as directed by your health care provider. Read labels and ask a pharmacist or your health care provider whether over-the-counter medicines, supplements, and prescription drugs are safe to take during pregnancy.  Activities  Be physically active as advised by your health care provider. Ask  your health care provider to recommend activities that are safe for you to do, such as walking or swimming.  Do not participate in strenuous or extreme sports.  Lifestyle  Do not drink alcohol.  Do not use any tobacco products, including cigarettes, chewing tobacco, or electronic cigarettes. If you need help quitting, ask your health care provider.  Do not use illegal drugs.  Safety  Avoid exposure to mercury, lead, or other heavy metals. Ask  your health care provider about common sources of these heavy metals.  Avoid listeria infection during pregnancy. Follow these precautions: ? Do not eat soft cheeses or deli meats. ? Do not eat hot dogs unless they have been warmed up to the point of steaming, such as in the microwave oven. ? Do not drink unpasteurized milk.  Avoid toxoplasmosis infection during pregnancy. Follow these precautions: ? Do not change your cat's litter box, if you have a cat. Ask someone else to do this for you. ? Wear gardening gloves while working in the yard.  General Instructions  Keep all follow-up visits as directed by your health care provider. This is important. This includes prenatal care and screening tests.  Manage any chronic health conditions. Work closely with your health care provider to keep conditions, such as diabetes, under control.  How do I know if my baby is developing well? At each prenatal visit, your health care provider will do several different tests to check on your health and keep track of your babys development. These include:  Fundal height. ? Your health care provider will measure your growing belly from top to bottom using a tape measure. ? Your health care provider will also feel your belly to determine your baby's position.  Heartbeat. ? An ultrasound in the first trimester can confirm pregnancy and show a heartbeat, depending on how far along you are. ? Your health care provider will check your baby's heart rate at every  prenatal visit. ? As you get closer to your delivery date, you may have regular fetal heart rate monitoring to make sure that your baby is not in distress.  Second trimester ultrasound. ? This ultrasound checks your baby's development. It also indicates your babys gender.  What should I do if I have concerns about my baby's development? Always talk with your health care provider about any concerns that you may have. This information is not intended to replace advice given to you by your health care provider. Make sure you discuss any questions you have with your health care provider. Document Released: 05/30/2008 Document Revised: 05/19/2016 Document Reviewed: 05/21/2014 Elsevier Interactive Patient Education  [employer].

## 2017-10-22 NOTE — MAU Provider Note (Signed)
Chief Complaint: Pelvic Pain and Vaginal Discharge   First Provider Initiated Contact with Patient 10/22/17 1207     SUBJECTIVE HPI: Lauren Mcconnell is a 26 y.o. G4P0030 at [redacted]w[redacted]d who presents to Maternity Admissions reporting abd pain since 10/16/17. [facility]   Vaginal Bleeding: Denies Passage of tissue or clots: Denies Dizziness: Denies Associated Sx: Pos for vaginal discharge. Neg for fever, chills, decreased appetite.   O POS  Pain Location: low abd. R>L Quality: cramping Severity: 4/10 on pain scale Duration: 6 days Course: Unchanged Context: Pos UPT. No other testing this pregnancy.  Timing: constant Modifying factors: None. There are no aggravating or aleviating factors. None. Hasn't tried anything for the pain.  Associated signs and symptoms: Neg for VB. Fever, chills, N/V/D/C. Pos for vaginal discharge.   Past Medical History:  Diagnosis Date  . Asthma    OB History  Gravida Para Term Preterm AB Living  4 0     3    SAB TAB Ectopic Multiple Live Births  1 2          # Outcome Date GA Lbr Len/2nd Weight Sex Delivery Anes PTL Lv  4 Current           3 TAB           2 TAB           1 SAB              Past Surgical History:  Procedure Laterality Date  . INDUCED ABORTION     Social History   Social History  . Marital status: Single    Spouse name: N/A  . Number of children: N/A  . Years of education: N/A   Occupational History  . Not on file.   Social History Main Topics  . Smoking status: Never Smoker  . Smokeless tobacco: Never Used  . Alcohol use Yes     Comment: Patient reports none in last 5 weeks  . Drug use: Yes    Types: Marijuana     Comment: Patient reports last used on August 25, 2017  . Sexual activity: Yes    Birth control/ protection: None   Other Topics Concern  . Not on file   Social History Narrative  . No narrative on file   No current facility-administered medications on file prior to encounter.     Current Outpatient Prescriptions on File Prior to Encounter  Medication Sig Dispense Refill  . albuterol (PROVENTIL HFA;VENTOLIN HFA) 108 (90 Base) MCG/ACT inhaler Inhale 2 puffs into the lungs every 6 (six) hours as needed for wheezing or shortness of breath.    . folic acid (FOLVITE) 400 MCG tablet Take 400 mcg by mouth daily.     Allergies  Allergen Reactions  . Aspirin Swelling and Other (See Comments)    Reaction:  Tongue swelling/blurry vision     I have reviewed the past Medical Hx, Surgical Hx, Social Hx, Allergies and Medications.   Review of Systems  Constitutional: Positive for appetite change. Negative for chills and fever.  Gastrointestinal: Positive for abdominal pain. Negative for constipation, diarrhea, nausea and vomiting.  Genitourinary: Positive for pelvic pain and vaginal discharge. Negative for difficulty urinating, dysuria, flank pain, frequency, hematuria, vaginal bleeding and vaginal pain.  Musculoskeletal: Negative for back pain.  Neurological: Negative for dizziness.    OBJECTIVE Patient Vitals for the past 24 hrs:  BP Temp Temp src Pulse Resp SpO2 Height Weight  10/22/17  1137 109/71 98.1 F (36.7 C) Oral 84 16 100 % 5\' 2"  (1.575 m) 130 lb 4 oz (59.1 kg)   Constitutional: Well-developed, well-nourished female in no acute distress.  Cardiovascular: normal rate Respiratory: normal rate and effort.  GI: Abd soft, mild RLQ TTP and ? Rebound tenderness. No palpable mass.  MS: Extremities nontender, no edema, normal ROM Neurologic: Alert and oriented x 4.  GU: Neg CVAT.  SPECULUM EXAM: NEFG except for NT 2 mm irritated area on posterior fourchette that pt attributes to IC, moderate amount of thin, white, malodorous discharge, no blood noted, cervix clean.   BIMANUAL: cervix closed; uterus top-normal size, pos right adnexal tenderness, No mass. No left adnexal tenderness or mass. No CMT.  LAB RESULTS Results for orders placed or performed during the  hospital encounter of 10/22/17 (from the past 24 hour(s))  Wet prep, genital     Status: Abnormal   Collection Time: 10/22/17 11:25 AM  Result Value Ref Range   Yeast Wet Prep HPF POC NONE SEEN NONE SEEN   Trich, Wet Prep NONE SEEN NONE SEEN   Clue Cells Wet Prep HPF POC PRESENT (A) NONE SEEN   WBC, Wet Prep HPF POC FEW (A) NONE SEEN   Sperm NONE SEEN   hCG, quantitative, pregnancy     Status: Abnormal   Collection Time: 10/22/17 12:25 PM  Result Value Ref Range   hCG, Beta Chain, Quant, S 77,021 (H) <5 mIU/mL  CBC     Status: Abnormal   Collection Time: 10/22/17 12:25 PM  Result Value Ref Range   WBC 7.3 4.0 - 10.5 K/uL   RBC 4.01 3.87 - 5.11 MIL/uL   Hemoglobin 12.7 12.0 - 15.0 g/dL   HCT 60.435.6 (L) 54.036.0 - 98.146.0 %   MCV 88.8 78.0 - 100.0 fL   MCH 31.7 26.0 - 34.0 pg   MCHC 35.7 30.0 - 36.0 g/dL   RDW 19.111.9 47.811.5 - 29.515.5 %   Platelets 251 150 - 400 K/uL  Urinalysis, Routine w reflex microscopic     Status: Abnormal   Collection Time: 10/22/17  1:35 PM  Result Value Ref Range   Color, Urine YELLOW YELLOW   APPearance CLEAR CLEAR   Specific Gravity, Urine 1.027 1.005 - 1.030   pH 6.0 5.0 - 8.0   Glucose, UA NEGATIVE NEGATIVE mg/dL   Hgb urine dipstick NEGATIVE NEGATIVE   Bilirubin Urine NEGATIVE NEGATIVE   Ketones, ur 20 (A) NEGATIVE mg/dL   Protein, ur NEGATIVE NEGATIVE mg/dL   Nitrite NEGATIVE NEGATIVE   Leukocytes, UA NEGATIVE NEGATIVE    IMAGING [place] Ob Comp Less 14 Wks  Result Date: 10/22/2017 CLINICAL DATA:  Pregnant patient with pelvic pain. EXAM: OBSTETRIC <14 WK US AND TRANSVAGINAL OB US TECHNIQUE: Both transabdominal and transvaginal ultrasound examinations were performed for complete evaluation of the gestation as well as the maternal uterus, adnexal regions, and pelvic cul-de-sac. Transvaginal technique was performed to assess early pregnancy. COMPARISON:  None. FINDINGS: Intrauterine gestational sac: Single Yolk sac:  Visualized. Embryo:  Visualized. Cardiac Activity:  Visualized. Heart Rate: 111  bpm MSD:   mm    w     d CRL:  7.3  mm   6 w   4 d                  [facility] Subchorionic hemorrhage:  There is a small subchorionic hemorrhage. Maternal uterus/adnexae: Normal in appearance. IMPRESSION: 1. Single live IUP.  Small subchorionic  hemorrhage. Electronically Signed   By: Gerome Sam III M.D   On: 10/22/2017 13:55   US Ob Transvaginal  Result Date: 10/22/2017 CLINICAL DATA:  Pregnant patient with pelvic pain. EXAM: OBSTETRIC <14 WK [place] AND TRANSVAGINAL OB US TECHNIQUE: Both transabdominal and transvaginal ultrasound examinations were performed for complete evaluation of the gestation as well as the maternal uterus, adnexal regions, and pelvic cul-de-sac. Transvaginal technique was performed to assess early pregnancy. COMPARISON:  None. FINDINGS: Intrauterine gestational sac: Single Yolk sac:  Visualized. Embryo:  Visualized. Cardiac Activity: Visualized. Heart Rate: 111  bpm MSD:   mm    w     d CRL:  7.3  mm   6 w   4 d                  [place] [facility]: June 13, 2018 Subchorionic hemorrhage:  There is a small subchorionic hemorrhage. Maternal uterus/adnexae: Normal in appearance. IMPRESSION: 1. Single live IUP.  Small subchorionic hemorrhage. Electronically Signed   By: Gerome Sam III M.D   On: 10/22/2017 13:55    MAU COURSE CBC, Quant, ABO/Rh, ultrasound, wet prep and GC/chlamydia culture, UA  MDM - Abd Pain in early pregnancy with normal intrauterine pregnancy and hemodynamically stable. - Pain likely 2/2 BV.   ASSESSMENT 1. Normal IUP (intrauterine pregnancy) on prenatal ultrasound, first trimester   2. Pelvic pain affecting pregnancy   3. BV (bacterial vaginosis)     PLAN Discharge home in stable condition. First trimester precautions List of providers given.  Rx. Flagyl.  Follow-up Information    Obstetrician of your choice Follow up.   Why:  start prenatal care       [facility]  MATERNITY ADMISS[facility]  Follow up.   Why:  in pregnancy emergencies Contact information: [address] [id number] mc [place] [address] [phone]         Allergies as of 10/22/2017      Reactions   Aspirin Swelling, Other (See Comments)   Reaction:  Tongue swelling/blurry vision       Medication List    TAKE these medications   albuterol 108 (90 Base) MCG/ACT inhaler Commonly known as:  PROVENTIL HFA;VENTOLIN HFA Inhale 2 puffs into the lungs every 6 (six) hours as needed for wheezing or shortness of breath.   folic acid 400 MCG tablet Commonly known as:  FOLVITE Take 400 mcg by mouth daily.   metroNIDAZOLE 500 MG tablet Commonly known as:  FLAGYL Take 1 tablet (500 mg total) by mouth 2 (two) times daily.   prenatal multivitamin Tabs tablet Take 1 tablet by mouth every evening.        Katrinka Blazing, [place], CNM 10/22/2017  2:52 PM  4

## 2017-10-22 NOTE — MAU Note (Addendum)
Patient states that she has been having pelvic pain since Monday, and vaginal discharge since yesterday. Denies any vaginal bleeding at this time. Patient reports that she had a miscarriage in March 2018.

## 2017-10-23 LAB — GC/CHLAMYDIA PROBE AMP (~~LOC~~) NOT AT ARMC
Chlamydia: NEGATIVE
Neisseria Gonorrhea: NEGATIVE

## 2017-10-23 LAB — HIV ANTIBODY (ROUTINE TESTING W REFLEX): HIV Screen 4th Generation wRfx: NONREACTIVE

## 2017-11-03 ENCOUNTER — Emergency Department (HOSPITAL_COMMUNITY)
Admission: EM | Admit: 2017-11-03 | Discharge: 2017-11-03 | Disposition: A | Discharge status: Home / Self Care | Payer: Medicaid Other | Attending: Emergency Medicine | Admitting: Emergency Medicine

## 2017-11-03 ENCOUNTER — Emergency Department (HOSPITAL_COMMUNITY): Payer: Medicaid Other

## 2017-11-03 ENCOUNTER — Encounter (HOSPITAL_COMMUNITY): Payer: Self-pay | Admitting: Emergency Medicine

## 2017-11-03 ENCOUNTER — Other Ambulatory Visit: Payer: Self-pay

## 2017-11-03 DIAGNOSIS — Z79899 Other long term (current) drug therapy: Secondary | ICD-10-CM | POA: Diagnosis not present

## 2017-11-03 DIAGNOSIS — Z3A09 9 weeks gestation of pregnancy: Secondary | ICD-10-CM | POA: Insufficient documentation

## 2017-11-03 DIAGNOSIS — O209 Hemorrhage in early pregnancy, unspecified: Secondary | ICD-10-CM | POA: Diagnosis not present

## 2017-11-03 DIAGNOSIS — J45909 Unspecified asthma, uncomplicated: Secondary | ICD-10-CM | POA: Insufficient documentation

## 2017-11-03 DIAGNOSIS — O9952 Diseases of the respiratory system complicating childbirth: Secondary | ICD-10-CM | POA: Insufficient documentation

## 2017-11-03 LAB — HCG, QUANTITATIVE, PREGNANCY: hCG, Beta Chain, Quant, S: 181548 m[IU]/mL — ABNORMAL HIGH (ref <5)

## 2017-11-03 NOTE — ED Provider Notes (Signed)
Care assumed from previous provider PA Sanders. Please see note for further details. Case discussed, plan agreed upon. Briefly, patient is a 26 y.o. female approximately [redacted] weeks pregnant who presents to ED with vaginal spotting. Recently had ultrasound at [facility]U with confirmed IUP and subchorionic hemorrhage. O+ blood type. Hcg trending upward appropriately. Will follow up on pending ultrasound.   Ultrasound reviewed with single live IUP, HR 168. Small to moderate stable subchorionic hemorrhage. Discussed results with patient. Follow up care with OB and reasons to return to ER discussed as well. All questions answered.    Lauren Mcconnell, [name], PA-C 11/03/17 [id number]    [name], [place], MD 11/04/17 1346

## 2017-11-03 NOTE — ED Provider Notes (Signed)
[facility]-EMERGENCY DEPT Provider Note   CSN: [id number] Arrival date & time: 11/03/17  0403     History   Chief Complaint Chief Complaint  Patient presents with  . Possible miscarriage  . Miscarriage    HPI Lauren Mcconnell is a 26 y.o. female.  The history is provided by the patient and medical records.    26 year old G4P0030 approximately [redacted] weeks gestation presenting to the ED with abdominal pain and vaginal spotting.  She was evaluated at MAU on 10/14/2017.  Had an ultrasound done then which confirmed IUP with subchorionic hemorrhage.  States she was also diagnosed with BV, completed her antibiotics and reports those symptoms have resolved.  She has not had any nausea, vomiting, diarrhea, urinary symptoms.  States spotting is "grapefruit color".  She denies any heavy bleeding or clots.  She has blood type O+  Past Medical History:  Diagnosis Date  . Asthma     There are no active problems to display for this patient.   Past Surgical History:  Procedure Laterality Date  . INDUCED ABORTION      OB History    Gravida Para Term Preterm AB Living   4 0     3     SAB TAB Ectopic Multiple Live Births   1 2             Home Medications    Prior to Admission medications   Medication Sig Start Date End Date Taking? Authorizing Provider  albuterol (PROVENTIL HFA;VENTOLIN HFA) 108 (90 Base) MCG/ACT inhaler Inhale 2 puffs into the lungs every 6 (six) hours as needed for wheezing or shortness of breath.    [provider]  folic acid (FOLVITE) 400 MCG tablet Take 400 mcg by mouth daily.    [provider]  metroNIDAZOLE (FLAGYL) 500 MG tablet Take 1 tablet (500 mg total) by mouth 2 (two) times daily. 10/22/17   Katrinka BlazingSmith, [place], CNM  Prenatal Vit-Fe Fumarate-FA (PRENATAL MULTIVITAMIN) TABS tablet Take 1 tablet by mouth every evening. 10/22/17   Dorathy KinsmanSmith, [place], CNM    Family History Family History  Problem Relation Age of Onset  .  Alcohol abuse Neg Hx   . Arthritis Neg Hx   . Asthma Neg Hx   . Birth defects Neg Hx   . Cancer Neg Hx   . COPD Neg Hx   . Depression Neg Hx   . Diabetes Neg Hx   . Drug abuse Neg Hx   . Early death Neg Hx   . Hearing loss Neg Hx   . Heart disease Neg Hx   . Hyperlipidemia Neg Hx   . Hypertension Neg Hx   . Kidney disease Neg Hx   . Learning disabilities Neg Hx   . Mental illness Neg Hx   . Mental retardation Neg Hx   . Miscarriages / Stillbirths Neg Hx   . Stroke Neg Hx   . Vision loss Neg Hx   . Varicose Veins Neg Hx     Social History Social History   Tobacco Use  . Smoking status: Never Smoker  . Smokeless tobacco: Never Used  Substance Use Topics  . Alcohol use: Yes    Comment: Patient reports none in last 5 weeks  . Drug use: Yes    Types: Marijuana    Comment: Patient reports last used on August 25, 2017     Allergies   Aspirin   Review of Systems Review of Systems  Gastrointestinal: Positive for  abdominal pain.  Genitourinary: Positive for vaginal bleeding.  All other systems reviewed and are negative.    Physical Exam Updated Vital Signs LMP 09/08/2017   Physical Exam  Constitutional: She is oriented to person, place, and time. She appears well-developed and well-nourished.  HENT:  Head: Normocephalic and atraumatic.  Mouth/Throat: Oropharynx is clear and moist.  Eyes: Conjunctivae and EOM are normal. Pupils are equal, round, and reactive to light.  Neck: Normal range of motion.  Cardiovascular: Normal rate, regular rhythm and normal heart sounds.  Pulmonary/Chest: Effort normal and breath sounds normal.  Abdominal: Soft. Bowel sounds are normal. There is no tenderness. There is no rebound.  Soft- non-tender  Musculoskeletal: Normal range of motion.  Neurological: She is alert and oriented to person, place, and time.  Skin: Skin is warm and dry.  Psychiatric: She has a normal mood and affect.  Nursing note and vitals reviewed.    ED  Treatments / Results  Labs (all labs ordered are listed, but only abnormal results are displayed) Labs Reviewed  HCG, QUANTITATIVE, PREGNANCY - Abnormal; Notable for the following components:      Result Value   hCG, Beta Chain, Quant, S 181,548 (*)    All other components within normal limits    EKG  EKG Interpretation None       Radiology No results found.  Procedures Procedures (including critical care time)  Medications Ordered in ED Medications - No data to display   Initial Impression / Assessment and Plan / ED Course  I have reviewed the triage vital signs and the nursing notes.  Pertinent labs & imaging results that were available during my care of the patient were reviewed by me and considered in my medical decision making (see chart for details).  26 year old G7P0030 approximately [redacted] weeks gestation, presenting to the ED with abdominal cramping and vaginal spotting.  Reports this began early this morning.  Has confirmed IUP by ultrasound on 10/22/2017 at [facility]U.  Abdomen soft, no peritonitis noted.  VSS.  hcg going up as expected.  Patient just had pelvic exam done with full STD panel, does not want it repeated today which I think is reasonable.  She is blood type O+ so not a rhogam candidate.  Prior US did show subchorionic hemorrhage which may be reason for her spotting, but nonetheless will repeat ultrasound to rule out any acute pathology.  6:34 AM US pending at this time.  Care signed out to oncoming PA.  Will follow-up on results and likely discharge to follow-up with women's clinic.  Final Clinical Impressions(s) / ED Diagnoses   Final diagnoses:  Vaginal bleeding in pregnancy, first trimester    ED Discharge Orders    None       Garlon HatchetSanders, Dereon Corkery M, PA-C 11/03/17 [id number]    Dione BoozeGlick, David, MD 11/03/17 [phone]

## 2017-11-03 NOTE — ED Triage Notes (Signed)
Patient complaining of upper right abdominal pain. Patient states pain started around 2:45 am. Patient is not having any nausea, vomiting, or diarrhea. Patient is [redacted] weeks pregnant having some spotting.

## 2017-11-03 NOTE — Discharge Instructions (Addendum)
Your pregnancy numbers are going up like we expected. Follow-up with women's clinic for your ongoing prenatal care or if you have any acute changes.

## 2017-11-03 NOTE — ED Notes (Signed)
US at bedside

## 2017-11-13 ENCOUNTER — Other Ambulatory Visit: Payer: Self-pay

## 2017-11-13 ENCOUNTER — Inpatient Hospital Stay (HOSPITAL_COMMUNITY)
Admission: AD | Admit: 2017-11-13 | Discharge: 2017-11-13 | Disposition: A | Discharge status: Home / Self Care | Payer: Medicaid Other | Source: Ambulatory Visit | Attending: Obstetrics & Gynecology | Admitting: Obstetrics & Gynecology

## 2017-11-13 DIAGNOSIS — R109 Unspecified abdominal pain: Secondary | ICD-10-CM | POA: Insufficient documentation

## 2017-11-13 DIAGNOSIS — Z3A01 Less than 8 weeks gestation of pregnancy: Secondary | ICD-10-CM

## 2017-11-13 DIAGNOSIS — B9689 Other specified bacterial agents as the cause of diseases classified elsewhere: Secondary | ICD-10-CM

## 2017-11-13 DIAGNOSIS — O208 Other hemorrhage in early pregnancy: Secondary | ICD-10-CM | POA: Diagnosis not present

## 2017-11-13 DIAGNOSIS — J45909 Unspecified asthma, uncomplicated: Secondary | ICD-10-CM | POA: Diagnosis not present

## 2017-11-13 DIAGNOSIS — O99511 Diseases of the respiratory system complicating pregnancy, first trimester: Secondary | ICD-10-CM | POA: Diagnosis not present

## 2017-11-13 DIAGNOSIS — O26891 Other specified pregnancy related conditions, first trimester: Secondary | ICD-10-CM | POA: Insufficient documentation

## 2017-11-13 DIAGNOSIS — Z3A09 9 weeks gestation of pregnancy: Secondary | ICD-10-CM | POA: Diagnosis not present

## 2017-11-13 DIAGNOSIS — Z79899 Other long term (current) drug therapy: Secondary | ICD-10-CM | POA: Diagnosis not present

## 2017-11-13 DIAGNOSIS — O418X10 Other specified disorders of amniotic fluid and membranes, first trimester, not applicable or unspecified: Secondary | ICD-10-CM

## 2017-11-13 DIAGNOSIS — O468X1 Other antepartum hemorrhage, first trimester: Secondary | ICD-10-CM

## 2017-11-13 DIAGNOSIS — N760 Acute vaginitis: Secondary | ICD-10-CM

## 2017-11-13 DIAGNOSIS — Z3491 Encounter for supervision of normal pregnancy, unspecified, first trimester: Secondary | ICD-10-CM

## 2017-11-13 LAB — URINALYSIS, ROUTINE W REFLEX MICROSCOPIC
BILIRUBIN URINE: NEGATIVE
GLUCOSE, UA: NEGATIVE mg/dL
HGB URINE DIPSTICK: NEGATIVE
KETONES UR: NEGATIVE mg/dL
Leukocytes, UA: NEGATIVE
Nitrite: NEGATIVE
PROTEIN: NEGATIVE mg/dL
Specific Gravity, Urine: 1.02 (ref 1.005–1.030)
pH: 6 (ref 5.0–8.0)

## 2017-11-13 LAB — CBC
HCT: 35.9 % — ABNORMAL LOW (ref 36.0–46.0)
Hemoglobin: 12.4 g/dL (ref 12.0–15.0)
MCH: 32 pg (ref 26.0–34.0)
MCHC: 34.5 g/dL (ref 30.0–36.0)
MCV: 92.8 fL (ref 78.0–100.0)
PLATELETS: 290 10*3/uL (ref 150–400)
RBC: 3.87 MIL/uL (ref 3.87–5.11)
RDW: 12.8 % (ref 11.5–15.5)
WBC: 9.7 10*3/uL (ref 4.0–10.5)

## 2017-11-13 MED ORDER — METRONIDAZOLE 0.75 % VA GEL: 1.0000 | Freq: Two times a day (BID) | VAGINAL | 0 refills | Status: DC

## 2017-11-13 MED ORDER — ACETAMINOPHEN 500 MG PO TABS
1000.0000 mg | ORAL_TABLET | Freq: Once | ORAL | Status: AC
  Administered 2017-11-13: 1000 mg via ORAL
  Filled 2017-11-13: qty 2

## 2017-11-13 NOTE — MAU Note (Signed)
Pt presents with lower abdominal pain on right side.  Reports having brown vaginal discharge.  Reports had pinkspotting last week.  Pt voiced concern b/c she had a miscarriage @ this time with previous pregnancy and is concerned.

## 2017-11-13 NOTE — Discharge Instructions (Signed)
Subchorionic Hematoma °A subchorionic hematoma is a gathering of blood between the outer wall of the placenta and the inner wall of the womb (uterus). The placenta is the organ that connects the fetus to the wall of the uterus. The placenta performs the feeding, breathing (oxygen to the fetus), and waste removal (excretory work) of the fetus. °Subchorionic hematoma is the most common abnormality found on a result from ultrasonography done during the first trimester or early second trimester of pregnancy. If there has been little or no vaginal bleeding, early small hematomas usually shrink on their own and do not affect your baby or pregnancy. The blood is gradually absorbed over 1-2 weeks. When bleeding starts later in pregnancy or the hematoma is larger or occurs in an older pregnant woman, the outcome may not be as good. Larger hematomas may get bigger, which increases the chances for miscarriage. Subchorionic hematoma also increases the risk of premature detachment of the placenta from the uterus, preterm (premature) labor, and stillbirth. °Follow these instructions at home: °· Stay on bed rest if your health care provider recommends this. Although bed rest will not prevent more bleeding or prevent a miscarriage, your health care provider may recommend bed rest until you are advised otherwise. °· Avoid heavy lifting (more than 10 lb [4.5 kg]), exercise, sexual intercourse, or douching as directed by your health care provider. °· Keep track of the number of pads you use each day and how soaked (saturated) they are. Write down this information. °· Do not use tampons. °· Keep all follow-up appointments as directed by your health care provider. Your health care provider may ask you to have follow-up blood tests or ultrasound tests or both. °Get help right away if: °· You have severe cramps in your stomach, back, abdomen, or pelvis. °· You have a fever. °· You pass large clots or tissue. Save any tissue for your  health care provider to look at. °· Your bleeding increases or you become lightheaded, feel weak, or have fainting episodes. °This information is not intended to replace advice given to you by your health care provider. Make sure you discuss any questions you have with your health care provider. °Document Released: 03/29/2007 Document Revised: 05/19/2016 Document Reviewed: 07/11/2013 °Elsevier Interactive Patient Education © 2017 Elsevier Inc[employer] ° ° ° ° °[place] [facility] ° ° °[facility]       Phone: [phone] ° °[facility] Phone: [phone] ° °[facility] [place]  Phone: [phone] ° °[facility] [place]  Phone: [phone] ° °[facility] [place]  Phone: [phone] ° °[facility]       Phone: [phone] ° °[facility]    Phone: [phone]  ° °[facility]    Phone: [phone] ° °[facility]    Phone: [phone] ° °[facility]    Phone: [phone]  ° °[facility]  Phone: [phone] ° °[facility]    Phone: [phone] ° °[facility]   Phone: [phone] ° °[facility]    Phone: [phone] ° °

## 2017-11-13 NOTE — MAU Provider Note (Signed)
History     CSN: [id number]  Arrival date and time: 11/13/17 1723   First Provider Initiated Contact with Patient 11/13/17 1802      Chief Complaint  Patient presents with  . Abdominal Pain   HPI Lauren Mcconnell is a 26 y.o. G4P0030 at 8567w3d who presents with abdominal pain & vaginal bleeding. Reports abdominal pain nightly for the last weeks that has been worse since this morning. Pain worse in RLQ. Rates pain 3/10. Took ibuprofen at 4 am this morning with mild relief. Denies fever/chills, n/v/d, constipation, or dysuria. Endorses some brown spotting. Denies recent intercourse. Has not started prenatal care.   OB History    Gravida Para Term Preterm AB Living   4 0     3     SAB TAB Ectopic Multiple Live Births   1 2            Past Medical History:  Diagnosis Date  . Asthma     Past Surgical History:  Procedure Laterality Date  . INDUCED ABORTION      Family History  Problem Relation Age of Onset  . Alcohol abuse Neg Hx   . Arthritis Neg Hx   . Asthma Neg Hx   . Birth defects Neg Hx   . Cancer Neg Hx   . COPD Neg Hx   . Depression Neg Hx   . Diabetes Neg Hx   . Drug abuse Neg Hx   . Early death Neg Hx   . Hearing loss Neg Hx   . Heart disease Neg Hx   . Hyperlipidemia Neg Hx   . Hypertension Neg Hx   . Kidney disease Neg Hx   . Learning disabilities Neg Hx   . Mental illness Neg Hx   . Mental retardation Neg Hx   . Miscarriages / Stillbirths Neg Hx   . Stroke Neg Hx   . Vision loss Neg Hx   . Varicose Veins Neg Hx     Social History   Tobacco Use  . Smoking status: Never Smoker  . Smokeless tobacco: Never Used  Substance Use Topics  . Alcohol use: Yes    Comment: Patient reports none in last 5 weeks  . Drug use: Yes    Types: Marijuana    Comment: Patient reports last used on August 25, 2017    Allergies:  Allergies  Allergen Reactions  . Aspirin Swelling and Other (See Comments)    Reaction:  Tongue swelling/blurry vision      Medications Prior to Admission  Medication Sig Dispense Refill Last Dose  . albuterol (PROVENTIL HFA;VENTOLIN HFA) 108 (90 Base) MCG/ACT inhaler Inhale 2 puffs into the lungs every 6 (six) hours as needed for wheezing or shortness of breath.   Past Week at Unknown time  . Prenatal Vit-Fe Fumarate-FA (PRENATAL MULTIVITAMIN) TABS tablet Take 1 tablet by mouth every evening. 30 tablet 12 11/13/2017 at Unknown time  . metroNIDAZOLE (FLAGYL) 500 MG tablet Take 1 tablet (500 mg total) by mouth 2 (two) times daily. (Patient not taking: Reported on 11/03/2017) 14 tablet 0 Completed Course at Unknown time    Review of Systems  Constitutional: Negative.   Gastrointestinal: Positive for abdominal pain. Negative for constipation, diarrhea, nausea and vomiting.  Genitourinary: Positive for vaginal bleeding. Negative for dysuria and vaginal discharge.   Physical Exam   Blood pressure 111/72, pulse 79, temperature 98 F (36.7 C), temperature source Oral, resp. rate 18, height 5\' 2"  (1.575 m), weight 137 lb  4 oz (62.3 kg), last menstrual period 09/08/2017, SpO2 98 %, unknown if currently breastfeeding.  Physical Exam  Nursing note and vitals reviewed. Constitutional: She is oriented to person, place, and time. She appears well-developed and well-nourished. No distress.  HENT:  Head: Normocephalic and atraumatic.  Eyes: Conjunctivae are normal. Right eye exhibits no discharge. Left eye exhibits no discharge. No scleral icterus.  Neck: Normal range of motion.  Respiratory: Effort normal. No respiratory distress.  GI: Soft. Bowel sounds are normal. There is tenderness in the right lower quadrant. There is no rigidity, no guarding and no CVA tenderness.  Genitourinary: No bleeding in the vagina.  Genitourinary Comments: Cervix closed  Neurological: She is alert and oriented to person, place, and time.  Skin: Skin is warm and dry. She is not diaphoretic.  Psychiatric: She has a normal mood and affect.  Her behavior is normal. Judgment and thought content normal.    MAU Course  Procedures Results for orders placed or performed during the hospital encounter of 11/13/17 (from the past 24 hour(s))  Urinalysis, Routine w reflex microscopic     Status: None   Collection Time: 11/13/17  5:40 PM  Result Value Ref Range   Color, Urine YELLOW YELLOW   APPearance CLEAR CLEAR   Specific Gravity, Urine 1.020 1.005 - 1.030   pH 6.0 5.0 - 8.0   Glucose, UA NEGATIVE NEGATIVE mg/dL   Hgb urine dipstick NEGATIVE NEGATIVE   Bilirubin Urine NEGATIVE NEGATIVE   Ketones, ur NEGATIVE NEGATIVE mg/dL   Protein, ur NEGATIVE NEGATIVE mg/dL   Nitrite NEGATIVE NEGATIVE   Leukocytes, UA NEGATIVE NEGATIVE  CBC     Status: Abnormal   Collection Time: 11/13/17  6:41 PM  Result Value Ref Range   WBC 9.7 4.0 - 10.5 K/uL   RBC 3.87 3.87 - 5.11 MIL/uL   Hemoglobin 12.4 12.0 - 15.0 g/dL   HCT 16.135.9 (L) 09.636.0 - 04.546.0 %   MCV 92.8 78.0 - 100.0 fL   MCH 32.0 26.0 - 34.0 pg   MCHC 34.5 30.0 - 36.0 g/dL   RDW 40.912.8 81.111.5 - 91.415.5 %   Platelets 290 150 - 400 K/uL    MDM O positive Previous ultrasound shows moderate SCH. No blood on exam today & cervix closed. Informal bedside ultrasound shows IUP w/FHR of 176 CBC -- no leukocytosis Tylenol 1 gm PO -- pt reports improvement in symptoms  Assessment and Plan  A: 1. Abdominal pain during pregnancy in first trimester   2. Fetal heart tones present, first trimester   3. [redacted] weeks gestation of pregnancy   4. Subchorionic hematoma in first trimester, single or unspecified fetus    P: Discharge home Rx metrogel -- previously on flagyl for BV but states can't tolerate meds Start prenatal care Discussed reasons to return to MAU  Judeth HornErin Tylor Gambrill 11/13/2017, 6:03 PM

## 2017-11-20 ENCOUNTER — Inpatient Hospital Stay (HOSPITAL_COMMUNITY)
Admission: AD | Admit: 2017-11-20 | Discharge: 2017-11-20 | Discharge status: Against Medical Advice | Payer: Medicaid Other | Source: Ambulatory Visit | Attending: Obstetrics & Gynecology | Admitting: Obstetrics & Gynecology

## 2017-11-20 ENCOUNTER — Other Ambulatory Visit: Payer: Self-pay

## 2017-11-20 DIAGNOSIS — O26891 Other specified pregnancy related conditions, first trimester: Secondary | ICD-10-CM | POA: Diagnosis present

## 2017-11-20 DIAGNOSIS — Z5321 Procedure and treatment not carried out due to patient leaving prior to being seen by health care provider: Secondary | ICD-10-CM | POA: Diagnosis not present

## 2017-11-20 DIAGNOSIS — Z3A11 11 weeks gestation of pregnancy: Secondary | ICD-10-CM | POA: Diagnosis not present

## 2017-11-20 DIAGNOSIS — N898 Other specified noninflammatory disorders of vagina: Secondary | ICD-10-CM

## 2017-11-20 LAB — URINALYSIS, ROUTINE W REFLEX MICROSCOPIC
BILIRUBIN URINE: NEGATIVE
Glucose, UA: NEGATIVE mg/dL
HGB URINE DIPSTICK: NEGATIVE
Ketones, ur: NEGATIVE mg/dL
Leukocytes, UA: NEGATIVE
NITRITE: NEGATIVE
PROTEIN: NEGATIVE mg/dL
SPECIFIC GRAVITY, URINE: 1.015 (ref 1.005–1.030)
pH: 7 (ref 5.0–8.0)

## 2017-11-20 NOTE — MAU Note (Signed)
Not in lobby

## 2017-11-20 NOTE — MAU Note (Addendum)
Having discharge today. No odor, no irritation, itching or burning, but it is not normal. Has pain in RLQ, off and on- not currently

## 2017-11-20 NOTE — MAU Note (Signed)
Patient not in lobby

## 2017-11-21 ENCOUNTER — Other Ambulatory Visit: Payer: Self-pay

## 2017-11-21 ENCOUNTER — Inpatient Hospital Stay (HOSPITAL_COMMUNITY)
Admission: AD | Admit: 2017-11-21 | Discharge: 2017-11-21 | Disposition: A | Discharge status: Home / Self Care | Payer: Medicaid Other | Source: Ambulatory Visit | Attending: Obstetrics & Gynecology | Admitting: Obstetrics & Gynecology

## 2017-11-21 ENCOUNTER — Encounter (HOSPITAL_COMMUNITY): Payer: Self-pay | Admitting: *Deleted

## 2017-11-21 ENCOUNTER — Telehealth (HOSPITAL_COMMUNITY): Payer: Self-pay

## 2017-11-21 DIAGNOSIS — Z3A10 10 weeks gestation of pregnancy: Secondary | ICD-10-CM | POA: Insufficient documentation

## 2017-11-21 DIAGNOSIS — Z202 Contact with and (suspected) exposure to infections with a predominantly sexual mode of transmission: Secondary | ICD-10-CM | POA: Diagnosis not present

## 2017-11-21 DIAGNOSIS — J45909 Unspecified asthma, uncomplicated: Secondary | ICD-10-CM | POA: Diagnosis not present

## 2017-11-21 DIAGNOSIS — O99511 Diseases of the respiratory system complicating pregnancy, first trimester: Secondary | ICD-10-CM | POA: Diagnosis not present

## 2017-11-21 DIAGNOSIS — O26891 Other specified pregnancy related conditions, first trimester: Secondary | ICD-10-CM | POA: Insufficient documentation

## 2017-11-21 DIAGNOSIS — Z79899 Other long term (current) drug therapy: Secondary | ICD-10-CM | POA: Insufficient documentation

## 2017-11-21 DIAGNOSIS — Z886 Allergy status to analgesic agent status: Secondary | ICD-10-CM | POA: Insufficient documentation

## 2017-11-21 DIAGNOSIS — N898 Other specified noninflammatory disorders of vagina: Secondary | ICD-10-CM | POA: Diagnosis not present

## 2017-11-21 LAB — WET PREP, GENITAL
Sperm: NONE SEEN
TRICH WET PREP: NONE SEEN
YEAST WET PREP: NONE SEEN

## 2017-11-21 LAB — OB RESULTS CONSOLE GC/CHLAMYDIA: Gonorrhea: POSITIVE

## 2017-11-21 MED ORDER — AZITHROMYCIN 250 MG PO TABS
1000.0000 mg | ORAL_TABLET | Freq: Once | ORAL | Status: AC
  Administered 2017-11-21: 1000 mg via ORAL
  Filled 2017-11-21: qty 4

## 2017-11-21 NOTE — MAU Provider Note (Signed)
Chief Complaint: Vaginal Itching and Vaginal Discharge   First Provider Initiated Contact with Patient 11/21/17 1354      SUBJECTIVE HPI: Lauren Mcconnell is a 26 y.o. G4P0030 at 3120w4d by LMP who presents to maternity admissions reporting increased white vaginal discharge with itching today. She had STD testing in MAU on 10/22/17 and again when she started prenatal care 2 weeks ago at [facility] and tests were all negative.  She completed a course of Metrogel for BV this week. Then, her female partner tested positive for chlamydia last week. She denies intercourse since he was treated but did have intercourse after her negative testing and before his positive test.  She denies any abdominal pain or n/v today.  She reports miscarriage x 1 in 2017 at 14 weeks with no FHR and open cervix, then onset of bleeding/cramping 2 days later.  She denies any other miscarriages, but reports TABs with other pregnancies.   Review of chart indicates SAB at 694w6d by LMP on 10/27/17 but no IUP visualized on US with thickened endometrium.  Pt was also seen in MAU on 03/15/17 with IUP at 7 weeks with no FHR and was given Cytotec at that time.  No documented evidence of cervical incompetence or second trimester loss in previous pregnancies. She denies vaginal bleeding, urinary symptoms, h/a, dizziness, n/v, or fever/chills.     HPI  Past Medical History:  Diagnosis Date  . Asthma    Past Surgical History:  Procedure Laterality Date  . INDUCED ABORTION     Social History   Socioeconomic History  . Marital status: Single    Spouse name: Not on file  . Number of children: Not on file  . Years of education: Not on file  . Highest education level: Not on file  Social Needs  . Financial resource strain: Not on file  . Food insecurity - worry: Not on file  . Food insecurity - inability: Not on file  . Transportation needs - medical: Not on file  . Transportation needs - non-medical: Not on file  Occupational History  .  Not on file  Tobacco Use  . Smoking status: Never Smoker  . Smokeless tobacco: Never Used  Substance and Sexual Activity  . Alcohol use: Yes    Comment: Patient reports none in last 5 weeks  . Drug use: Yes    Types: Marijuana    Comment: Patient reports last used on August 25, 2017  . Sexual activity: Yes    Birth control/protection: None  Other Topics Concern  . Not on file  Social History Narrative  . Not on file   No current facility-administered medications on file prior to encounter.    Current Outpatient Medications on File Prior to Encounter  Medication Sig Dispense Refill  . acetaminophen (TYLENOL) 500 MG tablet Take 1,000 mg by mouth every 6 (six) hours as needed for headache.    . albuterol (PROVENTIL HFA;VENTOLIN HFA) 108 (90 Base) MCG/ACT inhaler Inhale 2 puffs into the lungs every 6 (six) hours as needed for wheezing or shortness of breath.    . Prenatal Vit-Fe Fumarate-FA (PRENATAL MULTIVITAMIN) TABS tablet Take 1 tablet by mouth every evening. 30 tablet 12   Allergies  Allergen Reactions  . Aspirin Swelling and Other (See Comments)    Reaction:  Tongue swelling/blurry vision     ROS:  Review of Systems  Constitutional: Negative for chills, fatigue and fever.  Respiratory: Negative for shortness of breath.   Cardiovascular: Negative for chest  pain.  Gastrointestinal: Negative for abdominal pain, nausea and vomiting.  Genitourinary: Positive for vaginal discharge. Negative for difficulty urinating, dysuria, flank pain, pelvic pain, vaginal bleeding and vaginal pain.  Neurological: Negative for dizziness and headaches.  Psychiatric/Behavioral: Negative.      I have reviewed patient's Past Medical Hx, Surgical Hx, Family Hx, Social Hx, medications and allergies.   Physical Exam   Patient Vitals for the past 24 hrs:  BP Temp Temp src Pulse Resp  11/21/17 1504 117/68 - - 80 16  11/21/17 1251 103/69 98.3 F (36.8 C) Oral 70 16   Constitutional:  Well-developed, well-nourished female in no acute distress.  Cardiovascular: normal rate Respiratory: normal effort GI: Abd soft, non-tender. Pos BS x 4 MS: Extremities nontender, no edema, normal ROM Neurologic: Alert and oriented x 4.  GU: Neg CVAT.  PELVIC EXAM: Wet prep and GCC collected by blind swab  FHT not audible by doppler so bedside US reveals IUP c/w LMP dates, fetal movement reassuring for pt, FHR 174  LAB RESULTS Results for orders placed or performed during the hospital encounter of 11/21/17 (from the past 24 hour(s))  Wet prep, genital     Status: Abnormal   Collection Time: 11/21/17  2:16 PM  Result Value Ref Range   Yeast Wet Prep HPF POC NONE SEEN NONE SEEN   Trich, Wet Prep NONE SEEN NONE SEEN   Clue Cells Wet Prep HPF POC PRESENT (A) NONE SEEN   WBC, Wet Prep HPF POC FEW (A) NONE SEEN   Sperm NONE SEEN        IMAGING [place] Ob Comp Less 14 Wks  Result Date: 11/03/2017 CLINICAL DATA:  26 year old female with vaginal bleeding and acute right lower quadrant pain in the first trimester of pregnancy. Assigned gestational age [redacted] weeks 4 days. EXAM: OBSTETRIC <14 WK US AND TRANSVAGINAL OB US TECHNIQUE: Both transabdominal and transvaginal ultrasound examinations were performed for complete evaluation of the gestation as well as the maternal uterus, adnexal regions, and pelvic cul-de-sac. Transvaginal technique was performed to assess early pregnancy. COMPARISON:  10/22/2017 Ob ultrasound FINDINGS: Intrauterine gestational sac: Single Yolk sac:  Visible Embryo:  Visible Cardiac Activity: Detected Heart Rate: 168  bpm CRL:  18.5  mm   8 w   3 d                  US EDC: 06/12/2018 Subchorionic hemorrhage: Small to moderate volume (image 879), stable to mildly increased since 10/22/2017. Maternal uterus/adnexae: Right ovary probable corpus luteum (image 94). The right ovary otherwise appears normal and measures 4.4 x 2.9 by 3.0 cm. The left ovary appears normal measuring 2.2 x 2.6 x  1.5 cm. Small volume pelvic free fluid adjacent to the left adnexa. IMPRESSION: 1. Small to moderate volume of subchorionic hemorrhage appears stable to mildly increased since 10/22/2017. 2.  Single living IUP re- demonstrated. Electronically Signed   By: Odessa FlemingH  Hall M.D.   On: 11/03/2017 07:39   [place] Ob Transvaginal  Result Date: 11/03/2017 CLINICAL DATA:  26 year old female with vaginal bleeding and acute right lower quadrant pain in the first trimester of pregnancy. Assigned gestational age [redacted] weeks 4 days. EXAM: OBSTETRIC <14 WK US AND TRANSVAGINAL OB US TECHNIQUE: Both transabdominal and transvaginal ultrasound examinations were performed for complete evaluation of the gestation as well as the maternal uterus, adnexal regions, and pelvic cul-de-sac. Transvaginal technique was performed to assess early pregnancy. COMPARISON:  10/22/2017 Ob ultrasound FINDINGS: Intrauterine gestational sac: Single Yolk sac:  Visible Embryo:  Visible Cardiac Activity: Detected Heart Rate: 168  bpm CRL:  18.5  mm   8 w   3 d                  [place] EDC: 06/12/2018 Subchorionic hemorrhage: Small to moderate volume (image 79), stable to mildly increased since 10/22/2017. Maternal uterus/adnexae: Right ovary probable corpus luteum (image 94). The right ovary otherwise appears normal and measures 4.4 x 2.9 by 3.0 cm. The left ovary appears normal measuring 2.2 x 2.6 x 1.5 cm. Small volume pelvic free fluid adjacent to the left adnexa. IMPRESSION: 1. Small to moderate volume of subchorionic hemorrhage appears stable to mildly increased since 10/22/2017. 2.  Single living IUP re- demonstrated. Electronically Signed   By: Odessa Fleming M.D.   On: 11/03/2017 07:39    MAU Management/MDM: With known exposure to chlamydia, pt treated today with azithromycin 1000 mg PO x 1 dose.  Testing performed again since all tests negative prior to today but pt was exposed to chlamydia.  Pt reports concerns about incompetent cervix since her cervix was open at 14  weeks and she miscarried.  Chart review indicates she had 2 first trimester SABs, no evidence of second trimester loss and no record of cervical incompetence.  Pt reports she may have had a visit with [facility] before her miscarriage and that is when they told her she had an open cervix.  She denies a second miscarriage and reports she had two TABs.  Records requested from [facility] and message sent to establish care with [facility]. Wet prep with clue cells but no odor with discharge or other criteria so will not treat again.  Pt discharged with strict infection precautions.  ASSESSMENT 1. Vaginal discharge during pregnancy in first trimester   2. Exposure to STD     PLAN Discharge home Allergies as of 11/21/2017      Reactions   Aspirin Swelling, Other (See Comments)   Reaction:  Tongue swelling/blurry vision       Medication List    STOP taking these medications   metroNIDAZOLE 0.75 % vaginal gel Commonly known as:  METROGEL VAGINAL     TAKE these medications   acetaminophen 500 MG tablet Commonly known as:  TYLENOL Take 1,000 mg by mouth every 6 (six) hours as needed for headache.   albuterol 108 (90 Base) MCG/ACT inhaler Commonly known as:  PROVENTIL HFA;VENTOLIN HFA Inhale 2 puffs into the lungs every 6 (six) hours as needed for wheezing or shortness of breath.   prenatal multivitamin Tabs tablet Take 1 tablet by mouth every evening.      Follow-up Information    Department, [facility] Follow up.   Why:  Keep scheduled appointments until your care is transferred to [facility].   Contact information: [address] [place] [address] [phone]        [facility] Follow up.   Specialty:  Obstetrics and Gynecology Why:  The office will call you with appointment for new OB visit.  Return to MAU as needed for emergencies. Contact information: [address] [place]  [address] [phone]          Sharen Counter Certified Nurse-Midwife 11/21/2017  3:07 PM

## 2017-11-21 NOTE — Telephone Encounter (Signed)
[facility] called stating pt was on the line and that she wanted to inform us that she had vomitted her pills up for chlamydia. [facility] gave name and DOB. Spoke with Estanislado SpireE. Lawrence, NP and was told that pt could go to local health dept to be retreated.

## 2017-11-21 NOTE — MAU Note (Signed)
Was here yesterday, left without being seen.  D/c persists, chunky white. Kind of itchy this morning.

## 2017-11-22 LAB — GC/CHLAMYDIA PROBE AMP (~~LOC~~) NOT AT ARMC
CHLAMYDIA, DNA PROBE: NEGATIVE
NEISSERIA GONORRHEA: NEGATIVE

## 2017-11-30 DIAGNOSIS — F4481 Dissociative identity disorder: Secondary | ICD-10-CM | POA: Diagnosis not present

## 2017-11-30 DIAGNOSIS — Z789 Other specified health status: Secondary | ICD-10-CM | POA: Diagnosis not present

## 2017-11-30 DIAGNOSIS — J45909 Unspecified asthma, uncomplicated: Secondary | ICD-10-CM | POA: Diagnosis not present

## 2017-11-30 DIAGNOSIS — Z3481 Encounter for supervision of other normal pregnancy, first trimester: Secondary | ICD-10-CM | POA: Diagnosis not present

## 2017-11-30 LAB — OB RESULTS CONSOLE RUBELLA ANTIBODY, IGM: RUBELLA: IMMUNE

## 2017-11-30 LAB — OB RESULTS CONSOLE HEPATITIS B SURFACE ANTIGEN: Hepatitis B Surface Ag: NEGATIVE

## 2017-11-30 LAB — OB RESULTS CONSOLE RPR: RPR: NONREACTIVE

## 2017-11-30 LAB — OB RESULTS CONSOLE ABO/RH: RH Type: POSITIVE

## 2017-11-30 LAB — OB RESULTS CONSOLE VARICELLA ZOSTER ANTIBODY, IGG: Varicella: IMMUNE

## 2017-11-30 LAB — CYSTIC FIBROSIS DIAGNOSTIC STUDY: INTERPRETATION-CFDNA: NEGATIVE

## 2017-11-30 LAB — OB RESULTS CONSOLE ANTIBODY SCREEN: Antibody Screen: NEGATIVE

## 2017-11-30 LAB — SICKLE CELL SCREEN: SICKLE CELL SCREEN: NORMAL

## 2017-12-06 ENCOUNTER — Other Ambulatory Visit (HOSPITAL_COMMUNITY): Payer: Self-pay | Admitting: Nurse Practitioner

## 2017-12-06 DIAGNOSIS — Z3682 Encounter for antenatal screening for nuchal translucency: Secondary | ICD-10-CM

## 2017-12-08 ENCOUNTER — Other Ambulatory Visit (HOSPITAL_COMMUNITY): Payer: Self-pay | Admitting: Nurse Practitioner

## 2017-12-08 ENCOUNTER — Encounter (HOSPITAL_COMMUNITY): Payer: Self-pay

## 2017-12-08 ENCOUNTER — Ambulatory Visit (HOSPITAL_COMMUNITY)
Admission: RE | Admit: 2017-12-08 | Discharge: 2017-12-08 | Disposition: A | Discharge status: Home / Self Care | Payer: Medicaid Other | Source: Ambulatory Visit | Attending: Nurse Practitioner | Admitting: Nurse Practitioner

## 2017-12-08 DIAGNOSIS — Z3682 Encounter for antenatal screening for nuchal translucency: Secondary | ICD-10-CM | POA: Insufficient documentation

## 2017-12-08 DIAGNOSIS — Z3A13 13 weeks gestation of pregnancy: Secondary | ICD-10-CM | POA: Diagnosis not present

## 2017-12-14 ENCOUNTER — Encounter: Payer: Self-pay | Admitting: *Deleted

## 2017-12-17 ENCOUNTER — Inpatient Hospital Stay (HOSPITAL_COMMUNITY)
Admission: AD | Admit: 2017-12-17 | Discharge: 2017-12-17 | Disposition: A | Discharge status: Home / Self Care | Payer: Medicaid Other | Source: Ambulatory Visit | Attending: Obstetrics & Gynecology | Admitting: Obstetrics & Gynecology

## 2017-12-17 DIAGNOSIS — Z3A14 14 weeks gestation of pregnancy: Secondary | ICD-10-CM | POA: Diagnosis not present

## 2017-12-17 DIAGNOSIS — J029 Acute pharyngitis, unspecified: Secondary | ICD-10-CM | POA: Insufficient documentation

## 2017-12-17 DIAGNOSIS — O98212 Gonorrhea complicating pregnancy, second trimester: Secondary | ICD-10-CM | POA: Insufficient documentation

## 2017-12-17 DIAGNOSIS — O99512 Diseases of the respiratory system complicating pregnancy, second trimester: Secondary | ICD-10-CM | POA: Diagnosis not present

## 2017-12-17 DIAGNOSIS — Z886 Allergy status to analgesic agent status: Secondary | ICD-10-CM | POA: Diagnosis not present

## 2017-12-17 DIAGNOSIS — Z79899 Other long term (current) drug therapy: Secondary | ICD-10-CM | POA: Insufficient documentation

## 2017-12-17 DIAGNOSIS — J45909 Unspecified asthma, uncomplicated: Secondary | ICD-10-CM | POA: Insufficient documentation

## 2017-12-17 LAB — URINALYSIS, ROUTINE W REFLEX MICROSCOPIC
Bilirubin Urine: NEGATIVE
Glucose, UA: NEGATIVE mg/dL
Hgb urine dipstick: NEGATIVE
KETONES UR: NEGATIVE mg/dL
LEUKOCYTES UA: NEGATIVE
NITRITE: NEGATIVE
PROTEIN: NEGATIVE mg/dL
Specific Gravity, Urine: 1.018 (ref 1.005–1.030)
pH: 6 (ref 5.0–8.0)

## 2017-12-17 MED ORDER — TERCONAZOLE 0.4 % VA CREA: 1.0000 | TOPICAL_CREAM | Freq: Every day | VAGINAL | 0 refills | Status: DC

## 2017-12-17 MED ORDER — CEFTRIAXONE SODIUM 250 MG IJ SOLR
250.0000 mg | Freq: Once | INTRAMUSCULAR | Status: AC
  Administered 2017-12-17: 250 mg via INTRAMUSCULAR
  Filled 2017-12-17: qty 250

## 2017-12-17 NOTE — MAU Provider Note (Signed)
Chief Complaint:  Vaginal Discharge and Sore Throat   Provider saw patient at 1215   HPI: Lauren Mcconnell is a 26 y.o. G6P0050 at 314w2dwho presents to maternity admissions reporting Gonorrhea infection.  States was tested at [facility] and told she had it.   Has appointment for treatment on 12/28/17.  Doesn't want to wait that long. Does not have any cramping or bleeding.  Marland Kitchen.Has history of multiple treatments for Chlamydia.  States boyfriend doesn't have GC.  She denies LOF, vaginal bleeding, vaginal itching/burning, urinary symptoms, h/a, dizziness, n/v, diarrhea, constipation or fever/chills.    Vaginal Discharge  The patient's primary symptoms include vaginal discharge. The patient's pertinent negatives include no genital itching, genital odor or pelvic pain. This is a new problem. The pain is mild. She is not pregnant. Pertinent negatives include no back pain, constipation, diarrhea, fever, nausea or vomiting. The vaginal discharge was normal. There has been no bleeding. Nothing aggravates the symptoms. She has tried nothing for the symptoms. She is sexually active. It is possible that her partner has an STD. She uses nothing for contraception.    RN Note: Patient reports vaginal discharge x 1 week. Was told she has STD    Past Medical History: Past Medical History:  Diagnosis Date  . Asthma     Past obstetric history: OB History  Gravida Para Term Preterm AB Living  6 0     5    SAB TAB Ectopic Multiple Live Births  2 3          # Outcome Date GA Lbr Len/2nd Weight Sex Delivery Anes PTL Lv  6 Current           5 SAB           4 TAB           3 TAB           2 TAB           1 SAB               Past Surgical History: Past Surgical History:  Procedure Laterality Date  . INDUCED ABORTION      Family History: Family History  Problem Relation Age of Onset  . Alcohol abuse Neg Hx   . Arthritis Neg Hx   . Asthma Neg Hx   . Birth defects Neg Hx   . Cancer Neg Hx    . COPD Neg Hx   . Depression Neg Hx   . Diabetes Neg Hx   . Drug abuse Neg Hx   . Early death Neg Hx   . Hearing loss Neg Hx   . Heart disease Neg Hx   . Hyperlipidemia Neg Hx   . Hypertension Neg Hx   . Kidney disease Neg Hx   . Learning disabilities Neg Hx   . Mental illness Neg Hx   . Mental retardation Neg Hx   . Miscarriages / Stillbirths Neg Hx   . Stroke Neg Hx   . Vision loss Neg Hx   . Varicose Veins Neg Hx     Social History: Social History   Tobacco Use  . Smoking status: Never Smoker  . Smokeless tobacco: Never Used  Substance Use Topics  . Alcohol use: Yes    Comment: none with pregnancy  . Drug use: Yes    Types: Marijuana    Comment: none with +UPT    Allergies:  Allergies  Allergen Reactions  . Aspirin Swelling  and Other (See Comments)    Reaction:  Tongue swelling/blurry vision     Meds:  Medications Prior to Admission  Medication Sig Dispense Refill Last Dose  . acetaminophen (TYLENOL) 500 MG tablet Take 1,000 mg by mouth every 6 (six) hours as needed for headache.   Taking  . albuterol (PROVENTIL HFA;VENTOLIN HFA) 108 (90 Base) MCG/ACT inhaler Inhale 2 puffs into the lungs every 6 (six) hours as needed for wheezing or shortness of breath.   Taking  . Prenatal Vit-Fe Fumarate-FA (PRENATAL MULTIVITAMIN) TABS tablet Take 1 tablet by mouth every evening. 30 tablet 12 Taking    I have reviewed patient's Past Medical Hx, Surgical Hx, Family Hx, Social Hx, medications and allergies.   ROS:  Review of Systems  Constitutional: Negative for fever.  Gastrointestinal: Negative for constipation, diarrhea, nausea and vomiting.  Genitourinary: Positive for vaginal discharge. Negative for pelvic pain.  Musculoskeletal: Negative for back pain.   Other systems negative  Physical Exam   Patient Vitals for the past 24 hrs:  BP Temp Temp src Pulse Resp  12/17/17 1201 123/71 97.9 F (36.6 C) Oral 79 16   Constitutional: Well-developed, well-nourished  female in no acute distress.  Cardiovascular: normal rate and rhythm Respiratory: normal effort, clear to auscultation bilaterally GI: Abd soft, non-tender, gravid appropriate for gestational age.   No rebound or guarding. MS: Extremities nontender, no edema, normal ROM Neurologic: Alert and oriented x 4.  GU: Neg CVAT.  PELVIC EXAM:  Declines exam   FHT:  148   Labs:    Results for orders placed or performed during the hospital encounter of 12/17/17 (from the past 24 hour(s))  Urinalysis, Routine w reflex microscopic     Status: Abnormal   Collection Time: 12/17/17 11:50 AM  Result Value Ref Range   Color, Urine YELLOW YELLOW   APPearance HAZY (A) CLEAR   Specific Gravity, Urine 1.018 1.005 - 1.030   pH 6.0 5.0 - 8.0   Glucose, UA NEGATIVE NEGATIVE mg/dL   Hgb urine dipstick NEGATIVE NEGATIVE   Bilirubin Urine NEGATIVE NEGATIVE   Ketones, ur NEGATIVE NEGATIVE mg/dL   Protein, ur NEGATIVE NEGATIVE mg/dL   Nitrite NEGATIVE NEGATIVE   Leukocytes, UA NEGATIVE NEGATIVE     Imaging:  none  MAU Course/MDM: I have ordered labs and reviewed results. UA negative.  Declines retesting for STDs Discussed history of STDs.  Pt wondered if her BV caused her GC.  Discussed sexual transmission. Discussed need for verification of both parties being treated  Treatments in MAU included Rocephin IM.    Assessment: Single IUP at 7321w2d Gonorrhea  Plan: Discharge home Rx Terazol 7 for possible future yeast infection Pelvic rest x 2 weeks Follow up in HD for prenatal visits and recheck  Encouraged to return here or to other Urgent Care/ED if she develops worsening of symptoms, increase in pain, fever, or other concerning symptoms.   Pt stable at time of discharge.  Wynelle BourgeoisMarie Julyan Gales CNM, MSN Certified Nurse-Midwife 12/17/2017 12:04 PM

## 2017-12-17 NOTE — MAU Note (Signed)
Patient reports vaginal discharge x 1 week. Was told she has STD

## 2017-12-17 NOTE — Progress Notes (Signed)
Called in Rx as it did not go through [facility] SignsWilliams, Denai Caba L, [place]

## 2017-12-18 ENCOUNTER — Other Ambulatory Visit (HOSPITAL_COMMUNITY): Payer: Self-pay

## 2017-12-21 ENCOUNTER — Encounter (HOSPITAL_COMMUNITY): Payer: Self-pay | Admitting: Emergency Medicine

## 2017-12-21 ENCOUNTER — Other Ambulatory Visit: Payer: Self-pay

## 2017-12-21 ENCOUNTER — Emergency Department (HOSPITAL_COMMUNITY)
Admission: EM | Admit: 2017-12-21 | Discharge: 2017-12-21 | Disposition: A | Discharge status: Home / Self Care | Payer: Medicaid Other | Attending: Emergency Medicine | Admitting: Emergency Medicine

## 2017-12-21 DIAGNOSIS — J45909 Unspecified asthma, uncomplicated: Secondary | ICD-10-CM | POA: Diagnosis not present

## 2017-12-21 DIAGNOSIS — H1089 Other conjunctivitis: Secondary | ICD-10-CM | POA: Diagnosis not present

## 2017-12-21 DIAGNOSIS — Z79899 Other long term (current) drug therapy: Secondary | ICD-10-CM | POA: Insufficient documentation

## 2017-12-21 DIAGNOSIS — O9989 Other specified diseases and conditions complicating pregnancy, childbirth and the puerperium: Secondary | ICD-10-CM | POA: Diagnosis not present

## 2017-12-21 DIAGNOSIS — H5711 Ocular pain, right eye: Secondary | ICD-10-CM | POA: Diagnosis present

## 2017-12-21 DIAGNOSIS — Z3A15 15 weeks gestation of pregnancy: Secondary | ICD-10-CM | POA: Insufficient documentation

## 2017-12-21 MED ORDER — ERYTHROMYCIN 5 MG/GM OP OINT: 1.0000 "application " | TOPICAL_OINTMENT | Freq: Four times a day (QID) | OPHTHALMIC | 1 refills | Status: DC

## 2017-12-21 MED ORDER — ERYTHROMYCIN 5 MG/GM OP OINT
1.0000 "application " | TOPICAL_OINTMENT | Freq: Four times a day (QID) | OPHTHALMIC | Status: DC
  Administered 2017-12-21: 1 via OPHTHALMIC
  Filled 2017-12-21: qty 7

## 2017-12-21 NOTE — ED Notes (Signed)
ED Provider at bedside. 

## 2017-12-21 NOTE — ED Provider Notes (Signed)
[facility] Provider Note   CSN: [id number] Arrival date & time: 12/21/17  0431     History   Chief Complaint Chief Complaint  Patient presents with  . Eye Pain    HPI Lauren Mcconnell is a 26 y.o. female.  The history is provided by the patient and medical records.  Eye Pain     26 y.o. F who is approx [redacted] weeks gestation, presenting to the ED with right eye pain/redness.  States she developed this yesterday but has been worsening.  States when she wakes up from sleep her eye is "matted shut".  States she has been cleaning it with washcloth but keeps getting crusting/drainage from the eye.  No fever, chills. No blurred vision.  No sick contacts with similar symptoms.  Does not wear contact lenses.  No trauma or FB exposure to the eye.  No abdominal pain, back pain, vaginal bleeding, discharge, or other pregnancy related issues.  Past Medical History:  Diagnosis Date  . Asthma     There are no active problems to display for this patient.   Past Surgical History:  Procedure Laterality Date  . INDUCED ABORTION      OB History    Gravida Para Term Preterm AB Living   6 0     5     SAB TAB Ectopic Multiple Live Births   2 3             Home Medications    Prior to Admission medications   Medication Sig Start Date End Date Taking? Authorizing Provider  acetaminophen (TYLENOL) 500 MG tablet Take 1,000 mg by mouth every 6 (six) hours as needed for headache.    [provider]  albuterol (PROVENTIL HFA;VENTOLIN HFA) 108 (90 Base) MCG/ACT inhaler Inhale 2 puffs into the lungs every 6 (six) hours as needed for wheezing or shortness of breath.    [provider]  Prenatal Vit-Fe Fumarate-FA (PRENATAL MULTIVITAMIN) TABS tablet Take 1 tablet by mouth every evening. 10/22/17   Katrinka BlazingSmith, [place], CNM  terconazole (TERAZOL 7) 0.4 % vaginal cream Place 1 applicator vaginally at bedtime. PRN yeast 12/17/17   Aviva SignsWilliams, Marie L,  CNM    Family History Family History  Problem Relation Age of Onset  . Alcohol abuse Neg Hx   . Arthritis Neg Hx   . Asthma Neg Hx   . Birth defects Neg Hx   . Cancer Neg Hx   . COPD Neg Hx   . Depression Neg Hx   . Diabetes Neg Hx   . Drug abuse Neg Hx   . Early death Neg Hx   . Hearing loss Neg Hx   . Heart disease Neg Hx   . Hyperlipidemia Neg Hx   . Hypertension Neg Hx   . Kidney disease Neg Hx   . Learning disabilities Neg Hx   . Mental illness Neg Hx   . Mental retardation Neg Hx   . Miscarriages / Stillbirths Neg Hx   . Stroke Neg Hx   . Vision loss Neg Hx   . Varicose Veins Neg Hx     Social History Social History   Tobacco Use  . Smoking status: Never Smoker  . Smokeless tobacco: Never Used  Substance Use Topics  . Alcohol use: Yes    Comment: none with pregnancy  . Drug use: Yes    Types: Marijuana    Comment: none with +UPT     Allergies  Aspirin   Review of Systems Review of Systems  Eyes: Positive for photophobia, pain, discharge and redness.  All other systems reviewed and are negative.    Physical Exam Updated Vital Signs BP 123/78 (BP Location: Right Arm)   Pulse 76   Temp 97.8 F (36.6 C) (Oral)   Resp 17   Ht 5\' 2"  (1.575 m)   Wt 62.6 kg (138 lb)   LMP 09/08/2017   SpO2 98%   BMI 25.24 kg/m   Physical Exam  Constitutional: She is oriented to person, place, and time. She appears well-developed and well-nourished.  HENT:  Head: Normocephalic and atraumatic.  Mouth/Throat: Oropharynx is clear and moist.  Eyes: Conjunctivae and EOM are normal. Pupils are equal, round, and reactive to light.  No lid edema or erythema; right conjunctiva is injected with drainage noted along the medial and lateral canthi; crusting along the upper lash line; EOMs fully intact and non-painful; PERRL  Neck: Normal range of motion.  Cardiovascular: Normal rate, regular rhythm and normal heart sounds.  Pulmonary/Chest: Effort normal and breath  sounds normal.  Abdominal: Soft. Bowel sounds are normal.  Musculoskeletal: Normal range of motion.  Neurological: She is alert and oriented to person, place, and time.  Skin: Skin is warm and dry.  Psychiatric: She has a normal mood and affect.  Nursing note and vitals reviewed.    ED Treatments / Results  Labs (all labs ordered are listed, but only abnormal results are displayed) Labs Reviewed - No data to display  EKG  EKG Interpretation None       Radiology No results found.  Procedures Procedures (including critical care time)  Medications Ordered in ED Medications  erythromycin ophthalmic ointment 1 application (1 application Right Eye Given 12/21/17 0458)     Initial Impression / Assessment and Plan / ED Course  I have reviewed the triage vital signs and the nursing notes.  Pertinent labs & imaging results that were available during my care of the patient were reviewed by me and considered in my medical decision making (see chart for details).  26 year old female here with right eye redness and irritation.  Has had some drainage as well.  No trauma or chemical/foreign body exposure to the eye.  Does not wear contact lenses.  Exam is concerning for conjunctivitis.  No lid edema or erythema concerning for preseptal or orbital cellulitis.  Given her pregnancy, will start on erythromycin ointment.  Close follow-up with PCP.  Discussed plan with patient, she acknowledged understanding and agreed with plan of care.  Return precautions given for new or worsening symptoms.  Final Clinical Impressions(s) / ED Diagnoses   Final diagnoses:  Other conjunctivitis of right eye    ED Discharge Orders        Ordered    erythromycin ophthalmic ointment  Every 6 hours     12/21/17 0521       Garlon HatchetSanders, Lisa M, PA-C 12/21/17 0550    Zadie RhineWickline, Donald, MD 12/21/17 [phone]

## 2017-12-21 NOTE — ED Triage Notes (Signed)
Pt having 4/10 right eye pain, redness and having secretion coming out of her eye for the past 3 days, pt wants to be check for possible pink eye.

## 2017-12-21 NOTE — Discharge Instructions (Signed)
Take the prescribed medication as directed. Keep the eye clean with clean wash cloth.  Recommend washing towels, cloths, pillow cases, etc after each use to avoid reinfection. Return to the ED for new or worsening symptoms.

## 2017-12-26 NOTE — L&D Delivery Note (Addendum)
Delivery Note Repetitive variables while pushing, but moderate variability and accels throughout. Pt pushed well. At 11:35 AM a viable female was delivered via Vaginal, Spontaneous (Presentation: OA, rotated to LOA).  APGAR: 6, 9; weight Pending .   Placenta status: spontaneous, intact.  Cord: 3VC with the following complications: loose nuchal, body and leg cord. Delivered baby through cord vis summersault maneuver. Cord pH: 7.27 vernous  Anesthesia:   Episiotomy: None Lacerations: None Suture Repair: NA Est. Blood Loss (mL): 250  Mom to postpartum.  Baby to Couplet care / Skin to Skin.  Please schedule this patient for Postpartum visit in: 4 weeks with the following provider: Any provider For C/S patients schedule nurse incision check in weeks 2 weeks: no Low risk pregnancy complicated by: Nothing Delivery mode:  SVD Anticipated Birth Control:  None PP Procedures needed: None  Schedule Integrated BH visit: no  Lauren Mcconnell 06/03/2018, 12:37 PM

## 2017-12-28 ENCOUNTER — Ambulatory Visit (INDEPENDENT_AMBULATORY_CARE_PROVIDER_SITE_OTHER): Payer: Medicaid Other | Admitting: Obstetrics and Gynecology

## 2017-12-28 ENCOUNTER — Encounter: Payer: Self-pay | Admitting: General Practice

## 2017-12-28 ENCOUNTER — Encounter: Payer: Self-pay | Admitting: Obstetrics and Gynecology

## 2017-12-28 ENCOUNTER — Other Ambulatory Visit: Payer: Self-pay

## 2017-12-28 VITALS — BP 112/76 | HR 82 | Temp 98.5°F | Wt 142.1 lb

## 2017-12-28 DIAGNOSIS — Z3480 Encounter for supervision of other normal pregnancy, unspecified trimester: Secondary | ICD-10-CM | POA: Insufficient documentation

## 2017-12-28 DIAGNOSIS — Z3689 Encounter for other specified antenatal screening: Secondary | ICD-10-CM

## 2017-12-28 LAB — GLUCOSE, 1 HOUR: Glucose Tolerance, 1 hour: 71

## 2017-12-28 LAB — DRUG SCREEN, URINE: DRUG SCREEN, URINE: NEGATIVE

## 2017-12-28 LAB — CULTURE, OB URINE: Urine Culture, OB: NEGATIVE

## 2017-12-28 LAB — HM PAP SMEAR: PAP SMEAR: NEGATIVE

## 2017-12-28 NOTE — Progress Notes (Signed)
New OB Note  12/28/2017   Clinic: [facility]  Chief Complaint: NOB  Transfer of Care Patient: yes, [facility]  History of Present Illness: Ms. Andalon is a 27 y.o. G6P0050 @ 15/6 weeks (EDC 6/21, based on Patient's last menstrual period was 09/08/2017.=6wk u/s).  Preg complicated by has Supervision of other normal pregnancy, antepartum on their problem list.   Any events prior to today's visit: no Her periods were: very irregular, sounds like a lot of metrorrhagia  She was using no method when she conceived.  She has Negative signs or symptoms of nausea/vomiting of pregnancy. She has Negative signs or symptoms of miscarriage or preterm labor On any medications around the time she conceived/early pregnancy: No   ROS: A 12-point review of systems was performed and negative, except as stated in the above HPI.  OBGYN History: As per HPI. OB History  Gravida Para Term Preterm AB Living  6 0     5    SAB TAB Ectopic Multiple Live Births  1 4          # Outcome Date GA Lbr Len/2nd Weight Sex Delivery Anes PTL Lv  6 Current           5 SAB 03/15/17 [redacted]w[redacted]d         4 TAB 10/27/16 [redacted]w[redacted]d         3 TAB           2 TAB           1 TAB             Obstetric Comments  G1: 2010 EAB via D&C 56m  G2: 2012 EAB 8wks pills  G3: unsure date 14wks EAB via D&C  G4: unsure date 9wks EAB pills  G5: 2018 missed AB at 8wks   Any issues with any prior pregnancies: not applicable Prior children are healthy, doing well, and without any problems or issues: not applicable History of pap smears: Yes. Last pap smear 2018 and results were NILM   Past Medical History: Past Medical History:  Diagnosis Date  . Asthma     Past Surgical History: Past Surgical History:  Procedure Laterality Date  . DILATION AND CURETTAGE OF UTERUS     x2 for EAB    Family History:  Family History  Problem Relation Age of Onset  . Alcohol abuse Neg Hx   . Arthritis Neg Hx   . Asthma Neg Hx   . Birth  defects Neg Hx   . Cancer Neg Hx   . COPD Neg Hx   . Depression Neg Hx   . Diabetes Neg Hx   . Drug abuse Neg Hx   . Early death Neg Hx   . Hearing loss Neg Hx   . Heart disease Neg Hx   . Hyperlipidemia Neg Hx   . Hypertension Neg Hx   . Kidney disease Neg Hx   . Learning disabilities Neg Hx   . Mental illness Neg Hx   . Mental retardation Neg Hx   . Miscarriages / Stillbirths Neg Hx   . Stroke Neg Hx   . Vision loss Neg Hx   . Varicose Veins Neg Hx    She denies any history of mental retardation, birth defects or genetic disorders in her or the FOB's history  Social History:  Social History   Socioeconomic History  . Marital status: Single    Spouse name: Not on file  . Number of children:  Not on file  . Years of education: Not on file  . Highest education level: Not on file  Social Needs  . Financial resource strain: Not on file  . Food insecurity - worry: Not on file  . Food insecurity - inability: Not on file  . Transportation needs - medical: Not on file  . Transportation needs - non-medical: Not on file  Occupational History  . Not on file  Tobacco Use  . Smoking status: Never Smoker  . Smokeless tobacco: Never Used  Substance and Sexual Activity  . Alcohol use: No    Frequency: Never    Comment: none with pregnancy  . Drug use: Yes    Types: Marijuana    Comment: 3-4 mo ago  . Sexual activity: Not Currently    Birth control/protection: None  Other Topics Concern  . Not on file  Social History Narrative  . Not on file    Allergy: Allergies  Allergen Reactions  . Aspirin Swelling and Other (See Comments)    Reaction:  Tongue swelling/blurry vision     Health Maintenance:  Mammogram Up to Date: not applicable  Current Outpatient Medications: PNV  Physical Exam:   BP 112/76   Pulse 82   Temp 98.5 F (36.9 C)   Wt 142 lb 1.6 oz (64.5 kg)   LMP 09/08/2017   BMI 25.99 kg/m  Body mass index is 25.99 kg/m.   Vag. Bleeding:  None. Fundal height: not applicable FHTs: 150s  General appearance: Well nourished, well developed female in no acute distress.   [facility]. HIV still pending  Imaging:  Negative NT scan  Assessment: pt doing well  Plan: 1. Supervision of other normal pregnancy, antepartum Routine care. D/w her that she is low risk and she has no e/o cx incompetence in her history. She is at risk for that based on the procedures but no current e/o that currently.  - US MFM OB COMP + 14 WK; Future - Enroll patient in Babyscripts Program - AFP, Serum, Open Spina Bifida  Problem list reviewed and updated.  Follow up in 3 weeks.  >50% of 20 min visit spent on counseling and coordination of care.     Cornelia Copaharlie Sonny Anthes, Jr. MD Attending Center [facility](Faculty Practice)

## 2017-12-31 LAB — AFP, SERUM, OPEN SPINA BIFIDA
AFP MoM: 1.16
AFP Value: 39.4 ng/mL
GEST. AGE ON COLLECTION DATE: 15.6 wk
Maternal Age At EDD: 27.8 yr
OSBR RISK 1 IN: 10000
Test Results:: NEGATIVE
WEIGHT: 142 [lb_av]

## 2018-01-11 ENCOUNTER — Encounter (HOSPITAL_COMMUNITY): Payer: Self-pay | Admitting: Obstetrics and Gynecology

## 2018-01-19 ENCOUNTER — Ambulatory Visit (HOSPITAL_COMMUNITY)
Admission: RE | Admit: 2018-01-19 | Discharge: 2018-01-19 | Disposition: A | Discharge status: Home / Self Care | Payer: Medicaid Other | Source: Ambulatory Visit | Attending: Obstetrics and Gynecology | Admitting: Obstetrics and Gynecology

## 2018-01-19 ENCOUNTER — Encounter: Payer: Medicaid Other | Admitting: Obstetrics & Gynecology

## 2018-01-19 DIAGNOSIS — Z3480 Encounter for supervision of other normal pregnancy, unspecified trimester: Secondary | ICD-10-CM

## 2018-01-19 DIAGNOSIS — Z3689 Encounter for other specified antenatal screening: Secondary | ICD-10-CM | POA: Insufficient documentation

## 2018-01-19 DIAGNOSIS — Z3A19 19 weeks gestation of pregnancy: Secondary | ICD-10-CM | POA: Insufficient documentation

## 2018-01-22 ENCOUNTER — Encounter: Payer: Medicaid Other | Admitting: Advanced Practice Midwife

## 2018-01-23 ENCOUNTER — Ambulatory Visit (INDEPENDENT_AMBULATORY_CARE_PROVIDER_SITE_OTHER): Payer: Medicaid Other | Admitting: Advanced Practice Midwife

## 2018-01-23 DIAGNOSIS — Z3482 Encounter for supervision of other normal pregnancy, second trimester: Secondary | ICD-10-CM

## 2018-01-23 DIAGNOSIS — Z3480 Encounter for supervision of other normal pregnancy, unspecified trimester: Secondary | ICD-10-CM

## 2018-01-23 NOTE — Progress Notes (Signed)
Pt concerned about weight, states is eating but is not gaining weight.Does not experience Nausea or emesis.Pt tired a lot.

## 2018-01-23 NOTE — Patient Instructions (Signed)
AREA PEDIATRIC/FAMILY PRACTICE PHYSICIANS  [facility] [address], [address] [place], [place]  [address] Phone - [phone]   Fax - [phone]  [facility] [address] [place], [place] [address] Phone - [phone]   Fax - [phone]  JACK AMOS [address] [place], [place]  [address] Phone - [phone]   Fax - [phone]  [facility] [address] [place], [place]  [address] Phone - [phone]   Fax - [phone]  [facility] [address] [place], [place]  [address] Phone - [phone]   Fax - [phone]  [facility] [address] [place], [place]  [address] Phone - [phone]   Fax - [phone]  [facility] [address] [place], [place]  [address] Phone - [phone]   Fax - [phone]  [facility] [address], [address] [place], [place]  [address] Phone - [phone]   Fax - [phone]  [facility] [address] [place], [place]  [address] Phone - [phone]   Fax - [phone] [facility] [address] [place], [place]  [address] Phone - [phone]   Fax - [phone]  [facility] [address] [place], [place]  [address] Phone - [phone]   Fax - [phone]  [facility] [address] [place], [place]  [address] Phone - [phone]   Fax - [phone]  [facility] [address], [address] [place], [place]  [address] Phone - [phone]   Fax - [phone]  SHILPA GOSRANI [address] [place], [place]  [address] Phone - [phone]  [facility] [address] [place], [place]  [address] Phone - [phone]   Fax - [phone]  [facility] [address] [place], [place]  [address] Phone - [phone]   Fax - [phone]  [facility] [address] [place], [place]  [address] Phone - [phone]   Fax - [phone]  [facility] [address] [place], [place]  [address] Phone - [phone]   Fax - [phone]   [facility] [address] [place], [place]  [address] Phone - [phone]   Fax - [phone]  [facility] [address] [place], [place]  [address] Phone - [phone]   Fax - [phone]  DAVID RUBIN [address] [place], [place]  [address] Phone - [phone]   Fax - [phone]  [facility] [address] [place], [place]  [address] Phone - [phone]   Fax - [phone]  [facility] [address] [place], [place]  [address] Phone - [phone]   Fax - [phone] [facility] [address] [place], [place]  [address] Phone - [phone]   Fax - [phone]  [facility] [address] [place], [place]  [address] Phone - [phone]   Fax - [phone]  [facility] [address] [place], [place]  [address] Phone - [phone]   Fax - [phone]  [facility] Charlene Flemming MD [address] [place]  [address] Phone [phone]  Fax [phone]  Childbirth Education Options: [facility] Classes:  Childbirth education classes can help you   get ready for a positive parenting experience. You can also meet other expectant parents and get free stuff for your baby. Each class runs for five weeks on the same night and costs $45 for the mother-to-be and her support person. Medicaid covers the cost if you are eligible. Call [phone] to register. [facility] Childbirth Education:  [phone] or 5174845873 or  [email or web address]  Baby & Me Class: Discuss newborn & infant parenting and family adjustment issues with other new mothers in a relaxed environment. Each week brings a new speaker or baby-centered activity. We encourage new mothers to join [place] every Thursday at 11:00am. Babies birth until crawling. No registration or fee. Daddy [employer]: This course offers Dads-to-be the tools and knowledge needed to feel confident on their journey to becoming new fathers. Experienced dads, who have been trained as coaches, teach dads-to-be how to hold, comfort, diaper, swaddle and play with their infant while being able to support the new mom as well. A class for men taught by men. $25/dad Big Brother/Big Sister: Let your children share in the joy of a new brother or sister in this special class designed just for them. Class includes discussion about how families care for babies: swaddling, holding, diapering, safety as well as how they can be helpful in their new role. This class is designed for children ages 26 to 31, but any age is welcome. Please register each child individually. $5/child  Mom Talk: This mom-led group offers support and connection to mothers as they journey through the adjustments and struggles of that sometimes overwhelming first year after the birth of a child. Tuesdays at 10:00am and Thursdays at 6:00pm. Babies welcome. No registration or fee. Breastfeeding Support Group: This group is a mother-to-mother support circle where moms have the opportunity to share their breastfeeding experiences. A Lactation Consultant is present for questions and concerns. Meets each Tuesday at 11:00am. No fee or registration. Breastfeeding Your Baby: Learn what to expect in the first days of breastfeeding your newborn.  This class will help you feel more confident with the skills needed to begin your breastfeeding experience. Many new mothers are concerned about breastfeeding after leaving the hospital. This class  will also address the most common fears and challenges about breastfeeding during the first few weeks, months and beyond. (call for fee) Comfort Techniques and Tour: This 2 hour interactive class will provide you the opportunity to learn & practice hands-on techniques that can help relieve some of the discomfort of labor and encourage your baby to rotate toward the best position for birth. You and your partner will be able to try a variety of labor positions with birth balls and rebozos as well as practice breathing, relaxation, and visualization techniques. A tour of the [facility] is included with this class. $20 per registrant and support person Childbirth Class- Weekend Option: This class is a Weekend version of our Birth & Baby series. It is designed for parents who have a difficult time fitting several weeks of classes into their schedule. It covers the care of your newborn and the basics of labor and childbirth. It also includes a [place] [facility] and lunch. The class is held two consecutive days: beginning on Friday evening from 6:30 - 8:30 p.m. and the next day, Saturday from 9 a.m. - 4 p.m. (call for fee) Doren Custard Class: Interested in a waterbirth?  This informational class will help you discover whether waterbirth is the right fit for you.  Education about waterbirth itself, supplies you would need and how to assemble your support team is what you can expect from this class. Some obstetrical practices require this class in order to pursue a waterbirth. (Not all obstetrical practices offer waterbirth-check with your healthcare provider.) Register only the expectant mom, but you are encouraged to bring your partner to class! Required if planning waterbirth, no fee. Infant/Child CPR: Parents, grandparents, babysitters, and friends learn Cardio-Pulmonary Resuscitation skills for infants and children. You will also learn how to treat both conscious  and unconscious choking in infants and children. This Family & Friends program does not offer certification. Register each participant individually to ensure that enough mannequins are available. (Call for fee) Grandparent Love: Expecting a grandbaby? This class is for you! Learn about the latest infant care and safety recommendations and ways to support your own child as he or she transitions into the parenting role. Taught by Registered Nurses who are childbirth instructors, but most importantly...they are grandmothers too! $10/person. Childbirth Class- Natural Childbirth: This series of 5 weekly classes is for expectant parents who want to learn and practice natural methods of coping with the process of labor and childbirth. Relaxation, breathing, massage, visualization, role of the partner, and helpful positioning are highlighted. Participants learn how to be confident in their body's ability to give birth. This class will empower and help parents make informed decisions about their own care. Includes discussion that will help new parents transition into the immediate postpartum period. [facility] is included. We suggest taking this class between 25-32 weeks, but it's only a recommendation. $75 per registrant and one support person or $30 Medicaid. Childbirth Class- 3 week Series: This option of 3 weekly classes helps you and your labor partner prepare for childbirth. Newborn care, labor & birth, cesarean birth, pain management, and comfort techniques are discussed and a [place] [facility] is included. The class meets at the same time, on the same day of the week for 3 consecutive weeks beginning with the starting date you choose. $60 for registrant and one support person.  Marvelous Multiples: Expecting twins, triplets, or more? This class covers the differences in labor, birth, parenting, and breastfeeding issues that face multiples' parents.  NICU tour is included. Led by a Certified Childbirth Educator who is the mother of twins. No fee. Caring for Baby: This class is for expectant and adoptive parents who want to learn and practice the most up-to-date newborn care for their babies. Focus is on birth through the first six weeks of life. Topics include feeding, bathing, diapering, crying, umbilical cord care, circumcision care and safe sleep. Parents learn to recognize symptoms of illness and when to call the pediatrician. Register only the mom-to-be and your partner or support person can plan to come with you! $10 per registrant and support person Childbirth Class- online option: This online class offers you the freedom to complete a Birth and Baby series in the comfort of your own home. The flexibility of this option allows you to review sections at your own pace, at times convenient to you and your support people. It includes additional video information, animations, quizzes, and extended activities. Get organized with helpful eClass tools, checklists, and trackers. Once you register online for the class, you will receive an email within a few days to accept the invitation and begin the class when the time is right for you. The content will be available to you for 60 days. $  60 for 60 days of online access for you and your support people.  Local Doulas: Natural Baby Doulas [email or web address] Tel: [phone] [email or web address] [facility] [phone] [email or web address] The Labor Ladies  (also do waterbirth tub rental) [phone] [email or web address] [facility] [phone] [email or web address] Sacred Rhythms  [phone] [email or web address] [place] [facility] [employer] (PADA) [email or web address] http://www.padanc.org/index.htm [place] [facility]   [email or web address] Considering Waterbirth? Guide for patients at [facility]  Why consider waterbirth?  . Gentle birth for babies . Less pain medicine used in labor . May allow for passive descent/less pushing . May reduce perineal tears  . More mobility and instinctive maternal position changes . Increased maternal relaxation . Reduced blood pressure in labor  Is waterbirth safe? What are the risks of infection, drowning or other complications?  . Infection: o Very low risk (3.7 % for tub vs 4.8% for bed) o 7 in 8000 waterbirths with documented infection o Poorly cleaned equipment most common cause o Slightly lower group B strep transmission rate  . Drowning o Maternal:  - Very low risk   - Related to seizures or fainting o Newborn:  - Very low risk. No evidence of increased risk of respiratory problems in multiple large studies - Physiological protection from breathing under water - Avoid underwater birth if there are any fetal complications - Once baby's head is out of the water, keep it out.  . Birth complication o Some reports of cord trauma, but risk decreased by bringing baby to surface gradually o No evidence of increased risk of shoulder dystocia. Mothers can usually change positions faster in water than in a bed, possibly aiding the maneuvers to free the shoulder.   You must attend a [facility]birth class at [facility]  3rd Wednesday of every month from 7-9pm  Free  Register by calling [phone] or online at [email or web address]  Bring us the certificate from the class to your prenatal appointment  Meet with a midwife at 36 weeks to see if you can still plan a waterbirth and to sign the consent.   Purchase or rent the following supplies:   Water Birth Pool (Birth Pool in a Box or [place] for instance)  (Tubs start ~$125)  Single-use disposable tub liner designed for your brand of tub  New garden hose labeled  "lead-free", "suitable for drinking water",  Electric drain pump to remove water (We recommend 792 gallon per hour or greater pump.)   Separate garden hose to remove the dirty water  Fish net  Bathing suit top (optional)  Long-handled mirror (optional)  Places to purchase or rent supplies  [email or web address] for tub purchases and supplies  Waterbirthsolutions.com for tub purchases and supplies  The Labor Ladies (www.[email or web address]) $275 for tub rental/set-up & take down/kit   [facility] [facility] (http://www.padanc.org/MeetUs.htm) Information regarding doulas (labor support) who provide pool rentals  Our practice has a Birth Pool in a Box tub at the hospital that you may borrow on a first-come-first-served basis. It is your responsibility to to set up, clean and break down the tub. We cannot guarantee the availability of this tub in advance. You are responsible for bringing all accessories listed above. If you do not have all necessary supplies you cannot have a waterbirth.    Things that would prevent you from having a waterbirth:  Premature, <37wks  Previous cesarean birth  Presence of thick meconium-stained fluid  Multiple gestation (Twins, triplets, etc.)  Uncontrolled diabetes or gestational diabetes requiring medication  Hypertension requiring medication   or diagnosis of pre-eclampsia  Heavy vaginal bleeding  Non-reassuring fetal heart rate  Active infection (MRSA, etc.). Group B Strep is NOT a contraindication for  waterbirth.  If your labor has to be induced and induction method requires continuous  monitoring of the baby's heart rate  Other risks/issues identified by your obstetrical provider  Please remember that birth is unpredictable. Under certain unforeseeable circumstances your provider may advise against giving birth in the tub. These decisions will be made on a case-by-case basis and with the safety of you and your baby as our highest  priority.     Places to have your son circumcised:    [facility] [phone] while you are in hospital  [facility] [phone] $244 by 4 wks  [facility] [phone] $175 by 2 wks  [facility] [phone] $250 by 7 days [facility] [phone] $269 by 4 wks  These prices sometimes change but are roughly what you can expect to pay. Please call and confirm pricing.   Circumcision is considered an elective/non-medically necessary procedure. There are many reasons parents decide to have their sons circumsized. During the first year of life circumcised males have a reduced risk of urinary tract infections but after this year the rates between circumcised males and uncircumcised males are the same.  It is safe to have your son circumcised outside of the hospital and the places above perform them regularly.   Deciding about Circumcision in Baby Boys  (Up-to-date The Basics)  What is circumcision?  Circumcision is a surgery that removes the skin that covers the tip of the penis, called the "foreskin" Circumcision is usually done when a boy is between 78 and 81 days old. In the [place], circumcision is common. In some other countries, fewer boys are circumcised. Circumcision is a common tradition in some religions.  Should I have my baby boy circumcised?  There is no easy answer. Circumcision has some benefits. But it also has risks. After talking with your doctor, you will have to decide for yourself what is right for your family.  What are the benefits of circumcision?  Circumcised boys seem to have slightly lower rates of: ?Urinary tract infections ?Swelling of the opening at the tip of the penis Circumcised men seem to have slightly lower rates of: ?Urinary tract infections ?Swelling of the opening at the tip of the  penis ?Penis cancer ?HIV and other infections that you catch during sex ?Cervical cancer in the women they have sex with Even so, in the [place], the risks of these problems are small - even in boys and men who have not been circumcised. Plus, boys and men who are not circumcised can reduce these extra risks by: ?Cleaning their penis well ?Using condoms during sex  What are the risks of circumcision?  Risks include: ?Bleeding or infection from the surgery ?Damage to or amputation of the penis ?A chance that the doctor will cut off too much or not enough of the foreskin ?A chance that sex won't feel as good later in life Only about 1 out of every 200 circumcisions leads to problems. There is also a chance that your health insurance won't pay for circumcision.  How is circumcision done in baby boys?  First, the baby gets medicine for pain relief. This might be a cream on the skin or a shot into the base of the penis. Next, the doctor cleans the baby's penis well. Then he or she uses special tools to cut off the foreskin.  Finally, the doctor wraps a bandage (called gauze) around the baby's penis. If you have your baby circumcised, his doctor or nurse will give you instructions on how to care for him after the surgery. It is important that you follow those instructions carefully.  Safe Medications in Pregnancy   Acne: Benzoyl Peroxide Salicylic Acid  Backache/Headache: Tylenol: 2 regular strength every 4 hours OR              2 Extra strength every 6 hours  Colds/Coughs/Allergies: Benadryl (alcohol free) 25 mg every 6 hours as needed Breath right strips Claritin Cepacol throat lozenges Chloraseptic throat spray Cold-Eeze- up to three times per day Cough drops, alcohol free Flonase (by prescription only) Guaifenesin Mucinex Robitussin DM (plain only, alcohol free) Saline nasal spray/drops Sudafed (pseudoephedrine) & Actifed ** use only after [redacted] weeks gestation and if you  do not have high blood pressure Tylenol Vicks Vaporub Zinc lozenges Zyrtec   Constipation: Colace Ducolax suppositories Fleet enema Glycerin suppositories Metamucil Milk of magnesia Miralax Senokot Smooth move tea  Diarrhea: Kaopectate Imodium A-D  *NO pepto Bismol  Hemorrhoids: Anusol Anusol HC Preparation H Tucks  Indigestion: Tums Maalox Mylanta Zantac  Pepcid  Insomnia: Benadryl (alcohol free) 27m every 6 hours as needed Tylenol PM Unisom, no Gelcaps  Leg Cramps: Tums MagGel  Nausea/Vomiting:  Bonine Dramamine Emetrol Ginger extract Sea bands Meclizine  Nausea medication to take during pregnancy:  Unisom (doxylamine succinate 25 mg tablets) Take one tablet daily at bedtime. If symptoms are not adequately controlled, the dose can be increased to a maximum recommended dose of two tablets daily (1/2 tablet in the morning, 1/2 tablet mid-afternoon and one at bedtime). Vitamin B6 1035mtablets. Take one tablet twice a day (up to 200 mg per day).  Skin Rashes: Aveeno products Benadryl cream or 2551mvery 6 hours as needed Calamine Lotion 1% cortisone cream  Yeast infection: Gyne-lotrimin 7 Monistat 7   **If taking multiple medications, please check labels to avoid duplicating the same active ingredients **take medication as directed on the label ** Do not exceed 4000 mg of tylenol in 24 hours **Do not take medications that contain aspirin or ibuprofen

## 2018-01-23 NOTE — Progress Notes (Signed)
   PRENATAL VISIT NOTE  Subjective:  Bryan Lemmanika Glauser is a 27 y.o. G6P0050 at 7574w4d being seen today for ongoing prenatal care.  She is currently monitored for the following issues for this low-risk pregnancy and has Supervision of other normal pregnancy, antepartum on their problem list.  Patient reports no complaints other than poor sleep throughout the night, waking up every 9030min-1hr. No reason identified, no new stressors. Contractions: Not present. Vag. Bleeding: None.  Movement: Present. Denies leaking of fluid.   The following portions of the patient's history were reviewed and updated as appropriate: allergies, current medications, past family history, past medical history, past social history, past surgical history and problem list. Problem list updated.  Objective:   Vitals:   01/23/18 1559  BP: 108/67  Pulse: 87  Weight: 146 lb 1.6 oz (66.3 kg)    Fetal Status: Fetal Heart Rate (bpm): 147   Movement: Present     General:  Alert, oriented and cooperative. Patient is in no acute distress.  Skin: Skin is warm and dry. No rash noted.   Cardiovascular: Normal heart rate noted  Respiratory: Normal respiratory effort, no problems with respiration noted  Abdomen: Soft, gravid, appropriate for gestational age.  Pain/Pressure: Absent     Pelvic: Cervical exam deferred        Extremities: Normal range of motion.  Edema: Trace  Mental Status:  Normal mood and affect. Normal behavior. Normal judgment and thought content.   Assessment and Plan:  Pregnancy: G6P0050 at 4974w4d  1. Supervision of other normal pregnancy, antepartum - Encouraged good sleep patterns and dietary adjustments - Routine care, follow up in 4 weeks  Preterm labor symptoms and general obstetric precautions including but not limited to vaginal bleeding, contractions, leaking of fluid and fetal movement were reviewed in detail with the patient. Please refer to After Visit Summary for other counseling  recommendations.  Return in about 4 weeks (around 02/20/2018).   Asa SaunasNathan J Clemie General, Student-PA

## 2018-01-23 NOTE — Progress Notes (Signed)
   PRENATAL VISIT NOTE  Subjective:  Lauren Mcconnell is a 27 y.o. G6P0050 at 5126w4d being seen today for ongoing prenatal care.  She is currently monitored for the following issues for this low-risk pregnancy and has Supervision of other normal pregnancy, antepartum on their problem list.  Patient reports no complaints.  Contractions: Not present. Vag. Bleeding: None.  Movement: Present. Denies leaking of fluid.   The following portions of the patient's history were reviewed and updated as appropriate: allergies, current medications, past family history, past medical history, past social history, past surgical history and problem list. Problem list updated.  Objective:   Vitals:   01/23/18 1559  BP: 108/67  Pulse: 87  Weight: 146 lb 1.6 oz (66.3 kg)    Fetal Status: Fetal Heart Rate (bpm): 147   Movement: Present     General:  Alert, oriented and cooperative. Patient is in no acute distress.  Skin: Skin is warm and dry. No rash noted.   Cardiovascular: Normal heart rate noted  Respiratory: Normal respiratory effort, no problems with respiration noted  Abdomen: Soft, gravid, appropriate for gestational age.  Pain/Pressure: Absent     Pelvic: Cervical exam deferred        Extremities: Normal range of motion.  Edema: Trace  Mental Status:  Normal mood and affect. Normal behavior. Normal judgment and thought content.   Assessment and Plan:  Pregnancy: G6P0050 at 6026w4d  1. Supervision of other normal pregnancy, antepartum - Routine care   Preterm labor symptoms and general obstetric precautions including but not limited to vaginal bleeding, contractions, leaking of fluid and fetal movement were reviewed in detail with the patient. Please refer to After Visit Summary for other counseling recommendations.  Return in about 4 weeks (around 02/20/2018).   Thressa ShellerHeather Janene Yousuf, CNM

## 2018-02-14 ENCOUNTER — Encounter (HOSPITAL_COMMUNITY): Payer: Self-pay

## 2018-02-14 ENCOUNTER — Telehealth: Payer: Self-pay | Admitting: Obstetrics & Gynecology

## 2018-02-14 ENCOUNTER — Inpatient Hospital Stay (HOSPITAL_COMMUNITY)
Admission: AD | Admit: 2018-02-14 | Discharge: 2018-02-14 | Disposition: A | Discharge status: Home / Self Care | Payer: Medicaid Other | Source: Ambulatory Visit | Attending: Obstetrics & Gynecology | Admitting: Obstetrics & Gynecology

## 2018-02-14 DIAGNOSIS — O99512 Diseases of the respiratory system complicating pregnancy, second trimester: Secondary | ICD-10-CM | POA: Insufficient documentation

## 2018-02-14 DIAGNOSIS — J45909 Unspecified asthma, uncomplicated: Secondary | ICD-10-CM | POA: Insufficient documentation

## 2018-02-14 DIAGNOSIS — O23592 Infection of other part of genital tract in pregnancy, second trimester: Secondary | ICD-10-CM | POA: Diagnosis not present

## 2018-02-14 DIAGNOSIS — Z79899 Other long term (current) drug therapy: Secondary | ICD-10-CM | POA: Diagnosis not present

## 2018-02-14 DIAGNOSIS — O9989 Other specified diseases and conditions complicating pregnancy, childbirth and the puerperium: Secondary | ICD-10-CM

## 2018-02-14 DIAGNOSIS — O98812 Other maternal infectious and parasitic diseases complicating pregnancy, second trimester: Secondary | ICD-10-CM | POA: Diagnosis not present

## 2018-02-14 DIAGNOSIS — B9689 Other specified bacterial agents as the cause of diseases classified elsewhere: Secondary | ICD-10-CM | POA: Diagnosis not present

## 2018-02-14 DIAGNOSIS — R109 Unspecified abdominal pain: Secondary | ICD-10-CM

## 2018-02-14 DIAGNOSIS — N898 Other specified noninflammatory disorders of vagina: Secondary | ICD-10-CM | POA: Diagnosis not present

## 2018-02-14 DIAGNOSIS — N760 Acute vaginitis: Secondary | ICD-10-CM | POA: Diagnosis not present

## 2018-02-14 DIAGNOSIS — Z202 Contact with and (suspected) exposure to infections with a predominantly sexual mode of transmission: Secondary | ICD-10-CM

## 2018-02-14 DIAGNOSIS — O368120 Decreased fetal movements, second trimester, not applicable or unspecified: Secondary | ICD-10-CM | POA: Diagnosis present

## 2018-02-14 DIAGNOSIS — O26892 Other specified pregnancy related conditions, second trimester: Secondary | ICD-10-CM | POA: Insufficient documentation

## 2018-02-14 DIAGNOSIS — Z3A22 22 weeks gestation of pregnancy: Secondary | ICD-10-CM | POA: Insufficient documentation

## 2018-02-14 DIAGNOSIS — A749 Chlamydial infection, unspecified: Secondary | ICD-10-CM | POA: Insufficient documentation

## 2018-02-14 DIAGNOSIS — O98212 Gonorrhea complicating pregnancy, second trimester: Secondary | ICD-10-CM | POA: Insufficient documentation

## 2018-02-14 DIAGNOSIS — Z886 Allergy status to analgesic agent status: Secondary | ICD-10-CM | POA: Insufficient documentation

## 2018-02-14 HISTORY — DX: Gonococcal infection, unspecified: A54.9

## 2018-02-14 LAB — URINALYSIS, ROUTINE W REFLEX MICROSCOPIC
Bilirubin Urine: NEGATIVE
GLUCOSE, UA: NEGATIVE mg/dL
HGB URINE DIPSTICK: NEGATIVE
Ketones, ur: NEGATIVE mg/dL
Leukocytes, UA: NEGATIVE
Nitrite: NEGATIVE
PH: 5 (ref 5.0–8.0)
PROTEIN: NEGATIVE mg/dL
Specific Gravity, Urine: 1.019 (ref 1.005–1.030)

## 2018-02-14 LAB — WET PREP, GENITAL
SPERM: NONE SEEN
Trich, Wet Prep: NONE SEEN
Yeast Wet Prep HPF POC: NONE SEEN

## 2018-02-14 IMAGING — US US MFM OB COMP +14 WKS
1 series · 14 of 28 positions shown · non-contrast
Comparison: none

[Series 1: us mfm ob comp +14 wks · 100 acquisitions, 14 frames shown]
[im 4/100]
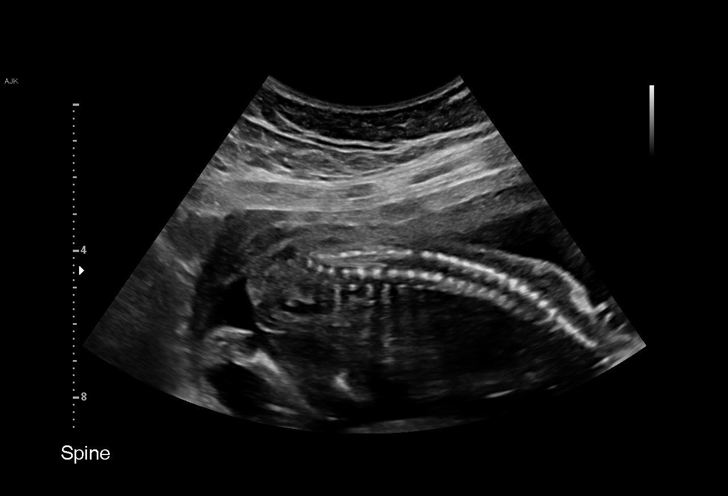
[im 12/100]
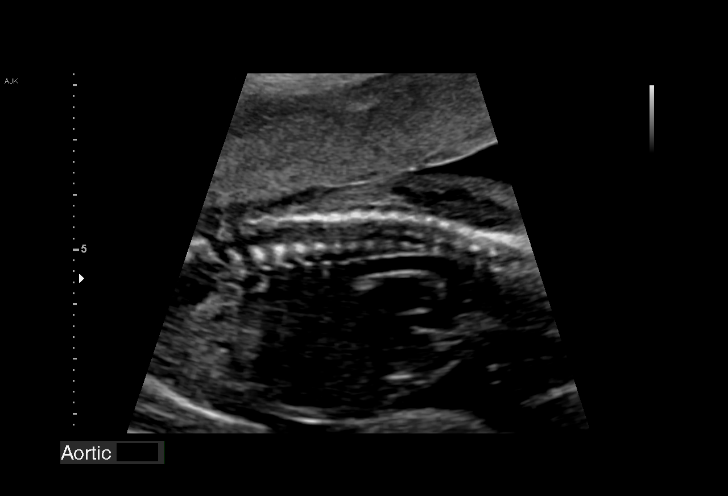
[im 19/100]
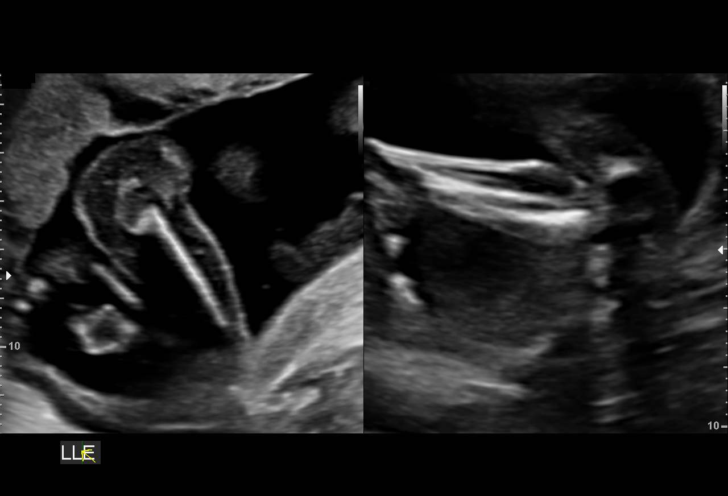
[im 26/100]
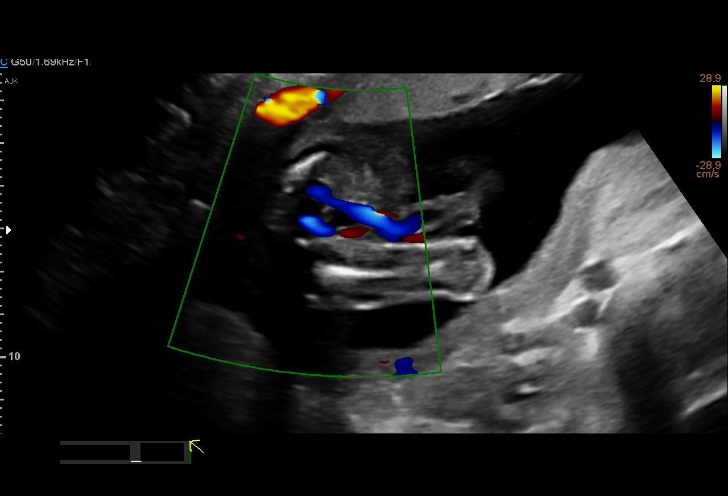
[im 34/100]
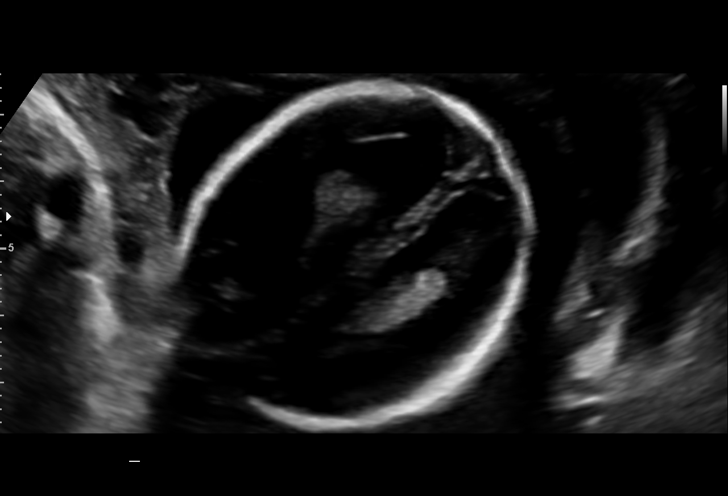
[im 41/100]
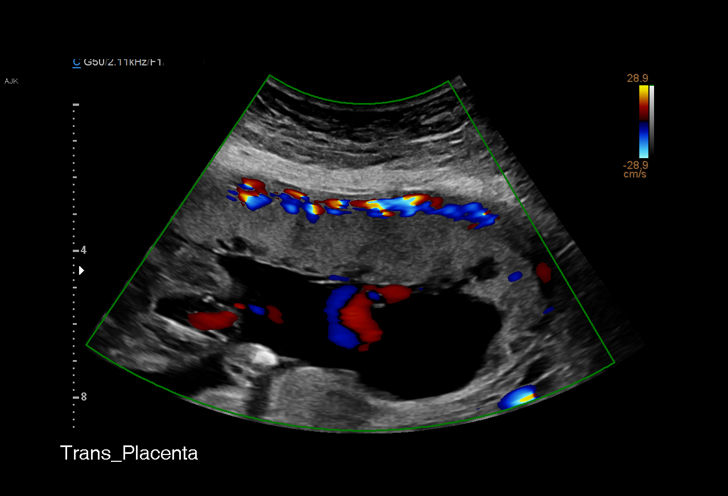
[im 48/100]
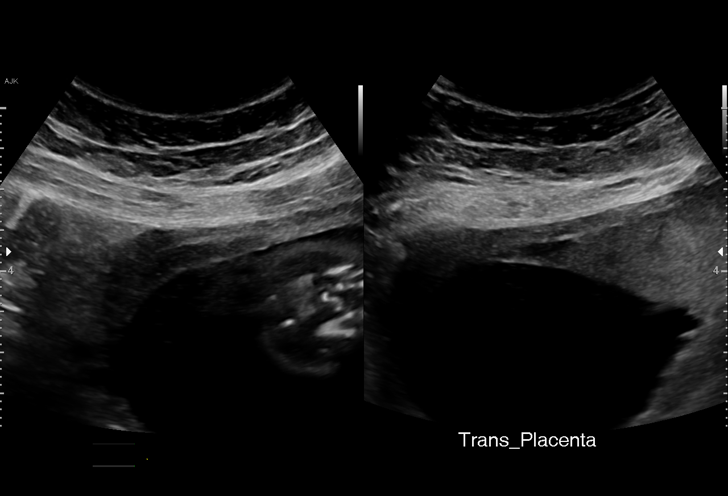
[im 56/100]
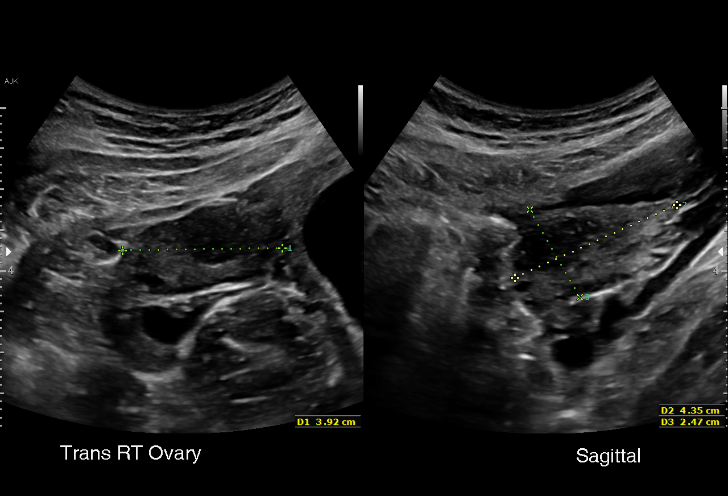
[im 63/100]
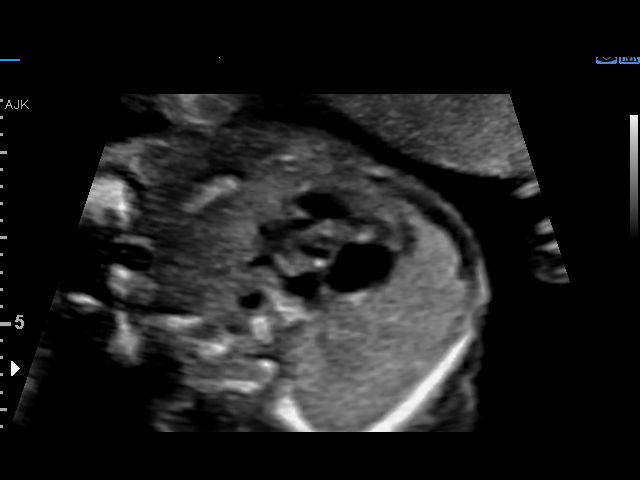
[im 70/100]
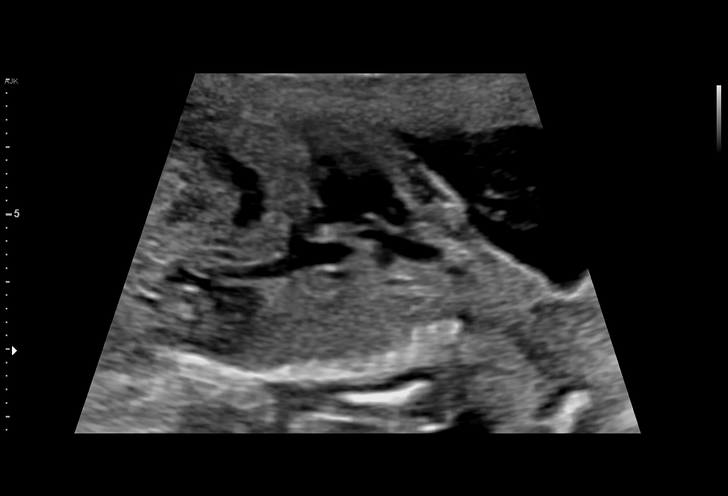
[im 78/100]
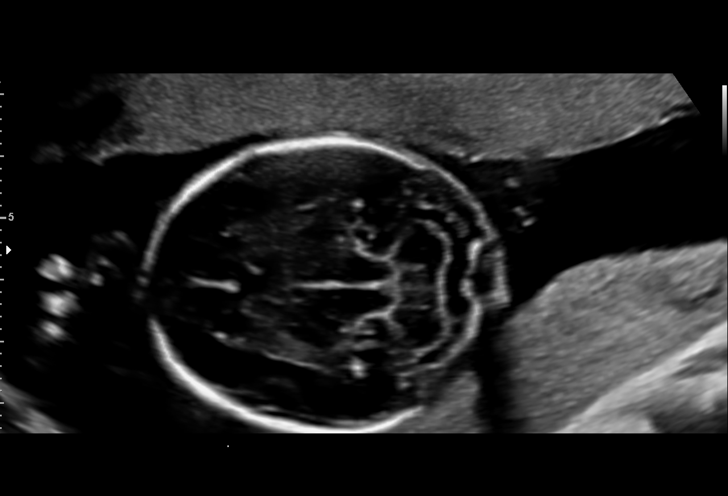
[im 85/100]
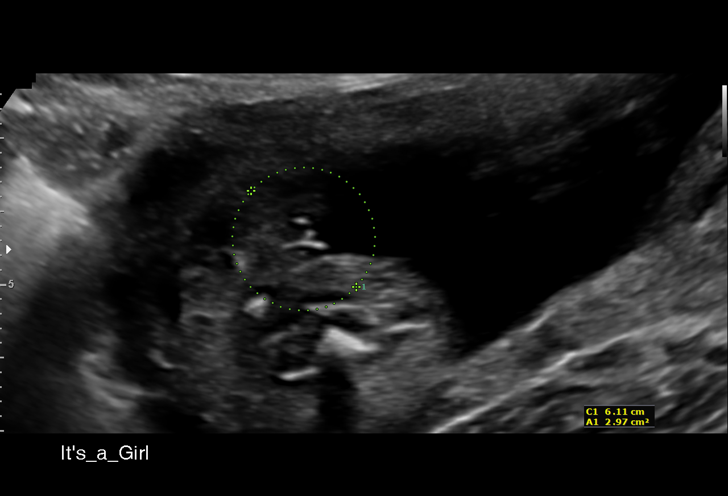
[im 92/100]
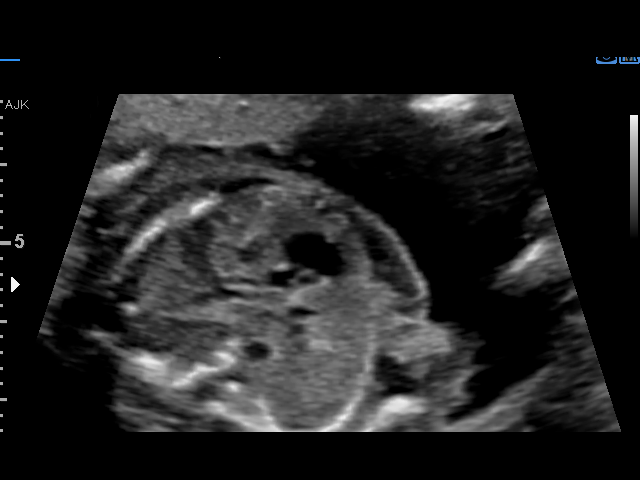
[im 100/100]
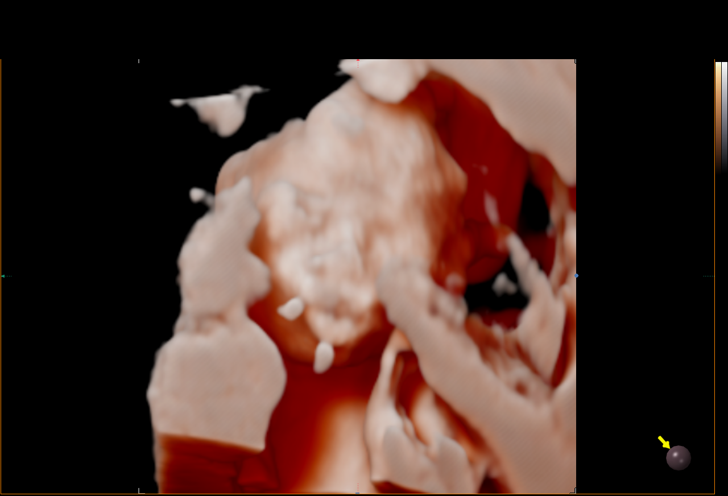

[14 of 28 positions shown; findings below may reference images not displayed]

[REDACTED]-
Faculty Physician

1  LKW TIGER          [id number]      [id number]     [id number]
Indications

19 weeks gestation of pregnancy
Encounter for fetal anatomic survey
OB History

Gravidity:    6         Term:   0        Prem:   0        SAB:   2
TOP:          3       Ectopic:  0        Living: 0
Fetal Evaluation

Num Of Fetuses:     1
Fetal Heart         132
Rate(bpm):
Cardiac Activity:   Observed
Presentation:       Cephalic
Placenta:           Anterior, above cervical os
P. Cord Insertion:  Visualized, central

Amniotic Fluid
AFI FV:      Subjectively within normal limits

Largest Pocket(cm)
4.84

Comment:    (3.4x.1x5.9) area in posterior right placenta, ? SCH.
Biometry

BPD:        47  mm     G. Age:  20w 1d         91  %    CI:        78.08   %    70 - 86
FL/HC:      17.5   %    16.1 -
HC:      168.3  mm     G. Age:  19w 4d         65  %    HC/AC:      1.13        1.09 -
AC:      148.9  mm     G. Age:  20w 1d         81  %    FL/BPD:     62.8   %
FL:       29.5  mm     G. Age:  19w 1d         46  %    FL/AC:      19.8   %    20 - 24
HUM:      28.6  mm     G. Age:  19w 2d         57  %
CER:      21.9  mm     G. Age:  20w 6d         93  %
NFT:       5.1  mm

CM:        2.8  mm

Est. FW:     306  gm    0 lb 11 oz      55  %
Gestational Age

LMP:           19w 0d        Date:  09/08/17                 EDD:   06/15/18
U/S Today:     19w 5d                                        EDD:   06/10/18
Best:          19w 0d     Det. By:  LMP  (09/08/17)          EDD:   06/15/18
Anatomy

Cranium:               Appears normal         Aortic Arch:            Appears normal
Cavum:                 Appears normal         Ductal Arch:            Appears normal
Ventricles:            Appears normal         Diaphragm:              Appears normal
Choroid Plexus:        Appears normal         Stomach:                Appears normal, left
sided
Cerebellum:            Appears normal         Abdomen:                Appears normal
Posterior Fossa:       Appears normal         Abdominal Wall:         Appears nml (cord
insert, abd wall)
Nuchal Fold:           Appears normal         Cord Vessels:           Appears normal (3
vessel cord)
Face:                  Appears normal         Kidneys:                Appear normal
(orbits and profile)
Lips:                  Appears normal         Bladder:                Appears normal
Thoracic:              Appears normal         Spine:                  Appears normal
Heart:                 Appears normal         Upper Extremities:      Appears normal
(4CH, axis, and
situs)
RVOT:                  Appears normal         Lower Extremities:      Appears normal
LVOT:                  Appears normal

Other:  Fetus appears to be a female. Heels and 5th digit visualized. Nasal
bone visualized.
Cervix Uterus Adnexa

Cervix
Length:           4.03  cm.
Normal appearance by transabdominal scan.

Uterus
No abnormality visualized.

Left Ovary
Within normal limits.

Right Ovary
Within normal limits.

Adnexa:       No abnormality visualized.
Impression

Single living intrauterine pregnancy at 19 weeks 0 days.
Appropriate fetal growth (55%).
Normal amniotic fluid volume.
The fetal anatomic survey is complete.
Normal fetal anatomy.
No fetal anomalies or soft markers of aneuploidy seen.
Recommendations

Follow-up ultrasounds as clinically indicated.

## 2018-02-14 MED ORDER — CEFTRIAXONE SODIUM 250 MG IJ SOLR
250.0000 mg | Freq: Once | INTRAMUSCULAR | Status: AC
  Administered 2018-02-14: 250 mg via INTRAMUSCULAR
  Filled 2018-02-14: qty 250

## 2018-02-14 MED ORDER — METRONIDAZOLE 500 MG PO TABS: 500.0000 mg | ORAL_TABLET | Freq: Two times a day (BID) | ORAL | 0 refills | Status: DC

## 2018-02-14 MED ORDER — AZITHROMYCIN 250 MG PO TABS
1000.0000 mg | ORAL_TABLET | Freq: Once | ORAL | Status: AC
  Administered 2018-02-14: 1000 mg via ORAL
  Filled 2018-02-14: qty 4

## 2018-02-14 NOTE — MAU Provider Note (Signed)
History     CSN: [id number]  Arrival date and time: 02/14/18 1013   First Provider Initiated Contact with Patient 02/14/18 1033      Chief Complaint  Patient presents with  . Abdominal Pain  . Decreased Fetal Movement  . Vaginal Discharge   HPI Ms. Lauren Mcconnell is a 27 y.o. G6P0050 at [redacted]w[redacted]d who presents to MAU today with complaint of STD exposure and decreased fetal movement over the last few days. The patient states that partner had + GC and Chlamydia testing recently. She has noted some RLQ pain recently that has been intermittent and mostly with ambulation. She denies bleeding, but has had increased white discharge.    OB History    Gravida Para Term Preterm AB Living   6 0     5     SAB TAB Ectopic Multiple Live Births   2 3            Obstetric Comments   G1: 2010 EAB via D&C 83m G2: 2012 EAB 8wks pills G3: unsure date 14wks EAB via D&C G4: unsure date 9wks EAB pills G5: 2018 missed AB at 8wks      Past Medical History:  Diagnosis Date  . Asthma   . Gonorrhea     Past Surgical History:  Procedure Laterality Date  . DILATION AND CURETTAGE OF UTERUS     x2 for EAB    Family History  Problem Relation Age of Onset  . Hypertension Mother   . Diabetes Maternal Grandmother   . Hypertension Maternal Grandmother   . Alcohol abuse Neg Hx   . Arthritis Neg Hx   . Asthma Neg Hx   . Birth defects Neg Hx   . Cancer Neg Hx   . COPD Neg Hx   . Depression Neg Hx   . Drug abuse Neg Hx   . Early death Neg Hx   . Hearing loss Neg Hx   . Heart disease Neg Hx   . Hyperlipidemia Neg Hx   . Kidney disease Neg Hx   . Learning disabilities Neg Hx   . Mental illness Neg Hx   . Mental retardation Neg Hx   . Miscarriages / Stillbirths Neg Hx   . Stroke Neg Hx   . Vision loss Neg Hx   . Varicose Veins Neg Hx     Social History   Tobacco Use  . Smoking status: Never Smoker  . Smokeless tobacco: Never Used  Substance Use Topics  . Alcohol use: No    Frequency:  Never    Comment: none with pregnancy  . Drug use: Yes    Types: Marijuana    Comment: 3-4 mo ago    Allergies:  Allergies  Allergen Reactions  . Aspirin Swelling and Other (See Comments)    Reaction:  Tongue swelling/blurry vision     Medications Prior to Admission  Medication Sig Dispense Refill Last Dose  . acetaminophen (TYLENOL) 500 MG tablet Take 1,000 mg by mouth every 6 (six) hours as needed for headache.   Taking  . albuterol (PROVENTIL HFA;VENTOLIN HFA) 108 (90 Base) MCG/ACT inhaler Inhale 2 puffs into the lungs every 6 (six) hours as needed for wheezing or shortness of breath.   Taking  . erythromycin ophthalmic ointment Place 1 application into the right eye every 6 (six) hours. Place 1/2 inch ribbon of ointment in the affected eye 4 times a day (Patient not taking: Reported on 01/23/2018) 1 g 1 Not Taking  .  Prenatal Vit-Fe Fumarate-FA (PRENATAL MULTIVITAMIN) TABS tablet Take 1 tablet by mouth every evening. 30 tablet 12 Taking    Review of Systems  Constitutional: Negative for fever.  Gastrointestinal: Positive for abdominal pain. Negative for constipation, diarrhea, nausea and vomiting.  Genitourinary: Positive for vaginal discharge. Negative for vaginal bleeding.   Physical Exam   Blood pressure 121/65, pulse 88, temperature 98.6 F (37 C), temperature source Oral, resp. rate 16, weight 149 lb 11.2 oz (67.9 kg), last menstrual period 09/08/2017, unknown if currently breastfeeding.  Physical Exam  Nursing note and vitals reviewed. Constitutional: She is oriented to person, place, and time. She appears well-developed and well-nourished. No distress.  HENT:  Head: Normocephalic and atraumatic.  Cardiovascular: Normal rate.  Respiratory: Effort normal.  GI: Soft. She exhibits no distension and no mass. There is tenderness (very mild RLQ tenderness to palpation). There is no rebound and no guarding.  Genitourinary: Uterus is enlarged (appropriate for GA). Uterus is  not tender. No bleeding in the vagina. Vaginal discharge (moderate, white, frothy discharge) found.  Neurological: She is alert and oriented to person, place, and time.  Skin: Skin is warm and dry. No erythema.  Psychiatric: She has a normal mood and affect.    Results for orders placed or performed during the hospital encounter of 02/14/18 (from the past 24 hour(s))  Wet prep, genital     Status: Abnormal   Collection Time: 02/14/18 10:33 AM  Result Value Ref Range   Yeast Wet Prep HPF POC NONE SEEN NONE SEEN   Trich, Wet Prep NONE SEEN NONE SEEN   Clue Cells Wet Prep HPF POC PRESENT (A) NONE SEEN   WBC, Wet Prep HPF POC FEW (A) NONE SEEN   Sperm NONE SEEN      MAU Course  Procedures None  MDM FHR - 154 bpm with doppler  UA, Wet prep, GC/Chlamydia, HIV, RPR today  Treat empirically for exposure to Gonorrhea and Chlamydia with 250 mg Rocephin and 1 G Zithromax today   Assessment and Plan  A: SIUP at 4054w5d STD exposure Bacterial vaginosis  Round ligament pain   P: Discharge home Rx for Flagyl send to patient's pharmacy  Abstinence x 2 weeks advised Will need TOC in the office in 4 weeks  Discussed expectations for FM at this gestation. Patient reassured.  Second trimester precautions discussed Discussed use of abdominal binder, Tylenol and moderation of activity for round ligament pain  Patient advised to follow-up with [facility] as scheduled for routine prenatal care Patient may return to MAU as needed or if her condition were to change or worsen  Vonzella NippleJulie Wenzel, PA-C 02/14/2018, 11:18 AM

## 2018-02-14 NOTE — MAU Note (Signed)
Reports decrease in fetal movement.  Been having a d/c with an odor, "he told he has gonorrhea and chlamydia, so I know that is what I got".  Pain in lower abd.

## 2018-02-14 NOTE — Discharge Instructions (Signed)
Bacterial Vaginosis Bacterial vaginosis is an infection of the vagina. It happens when too many germs (bacteria) grow in the vagina. This infection puts you at risk for infections from sex (STIs). Treating this infection can lower your risk for some STIs. You should also treat this if you are pregnant. It can cause your baby to be born early. Follow these instructions at home: Medicines  Take over-the-counter and prescription medicines only as told by your doctor.  Take or use your antibiotic medicine as told by your doctor. Do not stop taking or using it even if you start to feel better. General instructions  If you your sexual partner is a woman, tell her that you have this infection. She needs to get treatment if she has symptoms. If you have a female partner, he does not need to be treated.  During treatment: ? Avoid sex. ? Do not douche. ? Avoid alcohol as told. ? Avoid breastfeeding as told.  Drink enough fluid to keep your pee (urine) clear or pale yellow.  Keep your vagina and butt (rectum) clean. ? Wash the area with warm water every day. ? Wipe from front to back after you use the toilet.  Keep all follow-up visits as told by your doctor. This is important. Preventing this condition  Do not douche.  Use only warm water to wash around your vagina.  Use protection when you have sex. This includes: ? Latex condoms. ? Dental dams.  Limit how many people you have sex with. It is best to only have sex with the same person (be monogamous).  Get tested for STIs. Have your partner get tested.  Wear underwear that is cotton or lined with cotton.  Avoid tight pants and pantyhose. This is most important in summer.  Do not use any products that have nicotine or tobacco in them. These include cigarettes and e-cigarettes. If you need help quitting, ask your doctor.  Do not use illegal drugs.  Limit how much alcohol you drink. Contact a doctor if:  Your symptoms do not get  better, even after you are treated.  You have more discharge or pain when you pee (urinate).  You have a fever.  You have pain in your belly (abdomen).  You have pain with sex.  Your bleed from your vagina between periods. Summary  This infection happens when too many germs (bacteria) grow in the vagina.  Treating this condition can lower your risk for some infections from sex (STIs).  You should also treat this if you are pregnant. It can cause early (premature) birth.  Do not stop taking or using your antibiotic medicine even if you start to feel better. This information is not intended to replace advice given to you by your health care provider. Make sure you discuss any questions you have with your health care provider. Document Released: 09/20/2008 Document Revised: 08/27/2016 Document Reviewed: 08/27/2016 Elsevier Interactive Patient Education  2017 [employer]  Pregnancy and Sexually Transmitted Infections An STI (sexually transmitted infection) is a disease or infection that may be passed (transmitted) from person to person, usually during sexual activity. This may happen by way of saliva, semen, blood, vaginal mucus, or urine. An STI can be caused by bacteria, viruses, or parasites. During pregnancy, STIs can be dangerous for you and your unborn baby. It is important to take steps to reduce your chances of getting an STI. Also, you need to be seen by your health care provider right away if you think you  may have an STI, or if you think you may have been exposed to an STI. Diagnosis and treatment will depend on the type of STI. If you are already pregnant, you will be screened for HIV (human immunodeficiency virus) early in your pregnancy. If you are at high risk for HIV, this test may be repeated during your third trimester of pregnancy. What are some common STIs? There are different types of STIs. Some STIs that cause problems in pregnancy  include:  Gonorrhea.  Chlamydia.  Syphilis.  HIV and AIDS (acquired immunodeficiency syndrome).  Genital herpes.  Hepatitis.  Genital warts.  Human papillomavirus (HPV).  Trichomoniasis.  STIs that do not affect the baby include:  Chancroid.  Pubic lice.  What are the possible effects of STIs during pregnancy? STIs can cause:  Stillbirth.  Miscarriage.  Premature labor.  Premature rupture of the membranes.  Serious birth defects or deformities.  Infection of the amniotic sac.  Infections that occur after birth (postpartum) in you and the baby.  Slowed growth of the baby before birth.  Illnesses in newborns.  What are common symptoms of STIs? Different STIs have different symptoms. Some women may not have any symptoms. If symptoms are present, they may include:  Painful or bloody urination.  Pain in the pelvis, abdomen, vagina, anus, throat, or eyes.  A skin rash, itching, or irritation.  Growths, ulcerations, blisters, or sores in the genital and anal areas.  Fever.  Abnormal vaginal discharge, with or without bad odor.  Pain or bleeding during sexual intercourse.  Yellowing of the skin and the white parts of the eyes (jaundice).  Swollen glands in the groin area.  Even if symptoms are not present, an STI can still be passed to another person during sexual contact. How are STIs diagnosed? Your health care provider can use tests to determine if you have an STI. These may include blood tests, urine tests, and tests performed during a pelvic exam. You should be screened for STIs, including gonorrhea and chlamydia, if:  You are sexually active and are younger than age 30.  You are age 4 or older and your health care provider tells you that you are at risk for these types of infections.  Your sexual activity has changed since the last time you were screened, and you are at an increased risk for chlamydia or gonorrhea. Ask your health care  provider if you are at risk.  How can I reduce my risk of getting an STI? Take these actions to reduce your risk of getting an STI:  Do not have any oral, vaginal, or anal sex. This is known as practicing abstinence.  If you have sex, use a latex condom or a female condom consistently and correctly during sexual intercourse.  Use dental dams and water-soluble lubricants during sexual activity. Do not use petroleum jelly or oils.  Avoid having multiple sexual partners.  Do not have sex with someone who has other sexual partners.  Do not have sex with anyone you do not know or who is at high risk for an STI.  Avoid risky sex acts that can break the skin.  Do not have sex if you have open sores on your mouth or skin.  Avoid engaging in oral and anal sex acts.  Get the hepatitis vaccine. It is safe for pregnant women.  What should I do if I think I have an STI?  See your health care provider.  Tell your sexual partner or partners. They  should be tested and treated for any STIs.  Do not have sex until your health care provider says it is okay. Get help right away if: Contact your health care provider right away if:  You have any symptoms of an STI.  You think that you have, or your sexual partner has, an STI even if there are no symptoms.  You think that you may have been exposed to an STI.  This information is not intended to replace advice given to you by your health care provider. Make sure you discuss any questions you have with your health care provider. Document Released: 01/19/2005 Document Revised: 08/11/2016 Document Reviewed: 07/18/2016 Elsevier Interactive Patient Education  2018 [employer].  Chlamydia, Female Chlamydia is an STD (sexually transmitted disease). This is an infection that spreads through sexual contact. If it is not treated, it can cause serious problems. It must be treated with antibiotic medicine. Sometimes, you may not have symptoms  (asymptomatic). When you have symptoms, they can include:  Burning when you pee (urinate).  Peeing often.  Fluid (discharge) coming from the vagina.  Redness, soreness, and swelling (inflammation) of the butt (rectum).  Bleeding or fluid coming from the butt.  Belly (abdominal) pain.  Pain during sex.  Bleeding between periods.  Itching, burning, or redness in the eyes.  Fluid coming from the eyes.  Follow these instructions at home: Medicines  Take over-the-counter and prescription medicines only as told by your doctor.  Take your antibiotic medicine as told by your doctor. Do not stop taking the antibiotic even if you start to feel better. Sexual activity  Tell sex partners about your infection. Sex partners are people you had oral, anal, or vaginal sex with within 60 days of when you started getting sick. They need treatment, too.  Do not have sex until: ? You and your sex partners have been treated. ? Your doctor says it is okay.  If you have a single dose treatment, wait 7 days before having sex. General instructions  It is up to you to get your test results. Ask your doctor when your results will be ready.  Get a lot of rest.  Eat healthy foods.  Drink enough fluid to keep your pee (urine) clear or pale yellow.  Keep all follow-up visits as told by your doctor. You may need tests after 3 months. Preventing chlamydia  The only way to prevent chlamydia is not to have sex. To lower your risk: ? Use latex condoms correctly. Do this every time you have sex. ? Avoid having many sex partners. ? Ask if your partner has been tested for STDs and if he or she had negative results. Contact a doctor if:  You get new symptoms.  You do not get better with treatment.  You have a fever or chills.  You have pain during sex. Get help right away if:  Your pain gets worse and does not get better with medicine.  You get flu-like symptoms, such as: ? Night  sweats. ? Sore throat. ? Muscle aches.  You feel sick to your stomach (nauseous).  You throw up (vomit).  You have trouble swallowing.  You have bleeding: ? Between periods. ? After sex.  You have irregular periods.  You have belly pain that does not get better with medicine.  You have lower back pain that does not get better with medicine.  You feel weak or dizzy.  You pass out (faint).  You are pregnant and you get  symptoms of chlamydia. Summary  Chlamydia is an infection that spreads through sexual contact.  Sometimes, chlamydia can cause no symptoms (asymptomatic).  Do not have sex until your doctor says it is okay.  All sex partners will have to be treated for chlamydia. This information is not intended to replace advice given to you by your health care provider. Make sure you discuss any questions you have with your health care provider. Document Released: 09/20/2008 Document Revised: 12/01/2016 Document Reviewed: 12/01/2016 Elsevier Interactive Patient Education  2017 [employer]  Gonorrhea Gonorrhea is a sexually transmitted disease (STD) that can affect both men and women. If left untreated, this infection can:  Damage the female or female organs.  Cause women and men to be unable to have children (be sterile).  Harm a fetus if an infected woman is pregnant.  It is important to get treatment for gonorrhea as soon as possible. It is also necessary for all of your sexual partners to be tested for the infection. What are the causes? This condition is caused by bacteria called Neisseria gonorrhoeae. The infection is spread from person to person through sexual contact, including oral, anal, and vaginal sex. A newborn can contract the infection from his or her mother during birth. What increases the risk? The following factors may make you more likely to develop this condition:  Being a woman who is younger than 29 years of age and who is sexually  active.  Being a woman 8 years of age or older who has: ? A new sex partner. ? More than one sex partner. ? A sex partner who has an STD.  Being a man who has: ? A new sex partner. ? More than one sex partner. ? A sex partner who has an STD.  Using condoms inconsistently.  Currently having, or having previously had, an STD.  Exchanging sex for money or drugs.  What are the signs or symptoms? Some people do not have any symptoms. If you do have symptoms, they may be different for females and males. For females  Pain in the lower abdomen.  Abnormal vaginal discharge. The discharge may be cloudy, thick, or yellow-green in color.  Bleeding between periods.  Painful sex.  Burning or itching in and around the vagina.  Pain or burning when urinating.  Irritation, pain, bleeding, or discharge from the rectum. This may occur if the infection was spread by anal sex.  Sore throat or swollen lymph nodes in the neck. This may occur if the infection was spread by oral sex. For males  Abnormal discharge from the penis. This discharge may be cloudy, thick, or yellow-green in color.  Pain or burning during urination.  Pain or swelling in the testicles.  Irritation, pain, bleeding, or discharge from the rectum. This may occur if the infection was spread by anal sex.  Sore throat, fever, or swollen lymph nodes in the neck. This may occur if the infection was spread by oral sex. How is this diagnosed? This condition is diagnosed based on:  A physical exam.  A sample of discharge that is examined under a microscope to look for the bacteria. The discharge may be taken from the urethra, cervix, throat, or rectum.  Urine tests.  Not all of test results will be available during your visit. How is this treated? This condition is treated with antibiotic medicines. It is important for treatment to begin as soon as possible. Early treatment may prevent some problems from developing. Do  not  have sex during treatment. Avoid all types of sexual activity for 7 days after treatment is complete and until any sex partners have been treated. Follow these instructions at home:  Take over-the-counter and prescription medicines only as told by your health care provider.  Take your antibiotic medicine as told by your health care provider. Do not stop taking the antibiotic even if you start to feel better.  Do not have sex until at least 7 days after you and your partner(s) have finished treatment and your health care provider says it is okay.  It is your responsibility to get your test results. Ask your health care provider, or the department performing the test, when your results will be ready.  If you test positive for gonorrhea, inform your recent sexual partners. This includes any oral, anal, or vaginal sex partners. They need to be checked for gonorrhea even if they do not have symptoms. They may need treatment, even if they test negative for gonorrhea.  Keep all follow-up visits as told by your health care provider. This is important. How is this prevented?  Use latex condoms correctly every time you have sexual intercourse.  Ask if your sexual partner has been tested for STDs and had negative results.  Avoid having multiple sexual partners. Contact a health care provider if:  You develop a bad reaction to the medicine you were prescribed. This may include: ? A rash. ? Nausea. ? Vomiting. ? Diarrhea.  Your symptoms do not get better after a few days of taking antibiotics.  Your symptoms get worse.  You develop new symptoms.  Your pain gets worse.  You have a fever.  You develop pain, itching, or discharge around the eyes. Get help right away if:  You feel dizzy or faint.  You have trouble breathing or have shortness of breath.  You develop an irregular heartbeat.  You have severe abdominal pain with or without shoulder pain.  You develop any bumps or  sores (lesions) on your skin.  You develop warmth, redness, pain, or swelling around your joints, such as the knee. Summary  Gonorrhea is an STDthat can affect both men and women.  This condition is caused by bacteria called Neisseria gonorrhoeae. The infection is spread from person to person, usually through sexual contact, including oral, anal, and vaginal sex.  Symptoms vary between males and females. Generally, they include abnormal discharge and burning during urination. Women may also experience painful sex, itching around the vagina, and bleeding between menstrual periods. Men may also experience swelling of the testicles.  This condition is treated with antibiotic medicines. Do not have sex until at least 7 days after completing antibiotic treatment.  If left untreated, gonorrhea can have serious side effects and complications. This information is not intended to replace advice given to you by your health care provider. Make sure you discuss any questions you have with your health care provider. Document Released: 12/09/2000 Document Revised: 11/11/2016 Document Reviewed: 11/11/2016 Elsevier Interactive Patient Education  [employer].

## 2018-02-14 NOTE — Telephone Encounter (Signed)
Patient called and stated she has not felt the baby move in three days. [facility], patient was told to go to the [facility] for evaluation.

## 2018-02-15 LAB — GC/CHLAMYDIA PROBE AMP (~~LOC~~) NOT AT ARMC
CHLAMYDIA, DNA PROBE: NEGATIVE
NEISSERIA GONORRHEA: NEGATIVE

## 2018-02-15 LAB — RPR: RPR Ser Ql: NONREACTIVE

## 2018-02-15 LAB — HIV ANTIBODY (ROUTINE TESTING W REFLEX): HIV Screen 4th Generation wRfx: NONREACTIVE

## 2018-02-26 ENCOUNTER — Encounter: Payer: Medicaid Other | Admitting: Advanced Practice Midwife

## 2018-03-18 ENCOUNTER — Inpatient Hospital Stay (HOSPITAL_COMMUNITY)
Admission: AD | Admit: 2018-03-18 | Discharge: 2018-03-18 | Disposition: A | Discharge status: Home / Self Care | Payer: Medicaid Other | Source: Ambulatory Visit | Attending: Obstetrics and Gynecology | Admitting: Obstetrics and Gynecology

## 2018-03-18 ENCOUNTER — Encounter (HOSPITAL_COMMUNITY): Payer: Self-pay | Admitting: *Deleted

## 2018-03-18 ENCOUNTER — Inpatient Hospital Stay (HOSPITAL_COMMUNITY): Payer: Medicaid Other

## 2018-03-18 DIAGNOSIS — Z3A27 27 weeks gestation of pregnancy: Secondary | ICD-10-CM | POA: Insufficient documentation

## 2018-03-18 DIAGNOSIS — Z886 Allergy status to analgesic agent status: Secondary | ICD-10-CM | POA: Diagnosis not present

## 2018-03-18 DIAGNOSIS — M545 Low back pain: Secondary | ICD-10-CM | POA: Diagnosis present

## 2018-03-18 DIAGNOSIS — R1030 Lower abdominal pain, unspecified: Secondary | ICD-10-CM | POA: Insufficient documentation

## 2018-03-18 DIAGNOSIS — O0932 Supervision of pregnancy with insufficient antenatal care, second trimester: Secondary | ICD-10-CM

## 2018-03-18 DIAGNOSIS — R102 Pelvic and perineal pain unspecified side: Secondary | ICD-10-CM

## 2018-03-18 DIAGNOSIS — Z79899 Other long term (current) drug therapy: Secondary | ICD-10-CM | POA: Diagnosis not present

## 2018-03-18 DIAGNOSIS — R109 Unspecified abdominal pain: Secondary | ICD-10-CM

## 2018-03-18 DIAGNOSIS — O26892 Other specified pregnancy related conditions, second trimester: Secondary | ICD-10-CM | POA: Diagnosis not present

## 2018-03-18 DIAGNOSIS — J45909 Unspecified asthma, uncomplicated: Secondary | ICD-10-CM | POA: Diagnosis not present

## 2018-03-18 DIAGNOSIS — Z3686 Encounter for antenatal screening for cervical length: Secondary | ICD-10-CM

## 2018-03-18 DIAGNOSIS — O26899 Other specified pregnancy related conditions, unspecified trimester: Secondary | ICD-10-CM

## 2018-03-18 DIAGNOSIS — O99512 Diseases of the respiratory system complicating pregnancy, second trimester: Secondary | ICD-10-CM | POA: Insufficient documentation

## 2018-03-18 LAB — WET PREP, GENITAL
Clue Cells Wet Prep HPF POC: NONE SEEN
Sperm: NONE SEEN
Trich, Wet Prep: NONE SEEN
Yeast Wet Prep HPF POC: NONE SEEN

## 2018-03-18 LAB — FETAL FIBRONECTIN: Fetal Fibronectin: POSITIVE — AB

## 2018-03-18 MED ORDER — ACETAMINOPHEN 500 MG PO TABS
1000.0000 mg | ORAL_TABLET | Freq: Once | ORAL | Status: AC
  Administered 2018-03-18: 1000 mg via ORAL
  Filled 2018-03-18: qty 2

## 2018-03-18 MED ORDER — COMFORT FIT MATERNITY SUPP MED MISC: 1.0000 | Freq: Every day | 0 refills | Status: DC

## 2018-03-18 NOTE — MAU Provider Note (Signed)
Chief Complaint:  Abdominal Pain and Back Pain   First Provider Initiated Contact with Patient 03/18/18 2015      HPI: Lauren Mcconnell is a 27 y.o. G6P0050 at 4027w2dwho presents to maternity admissions via EMS reporting abdominal pain and back pain. She reports her abdominal pain started 2 days ago, she reports that the pain is sharp and throbbing. She reports the pain starts in her lower abdomen then radiates up the sides to her back. She reports constant low back pain for the past 2 days. She rates both pains a 5/10- has not taken anything for pain. She reports pink spotting that occurred last night and bright red bleeding last Wednesday after IC on Tuesday. She reports no IC since last Tuesday. She reports good fetal movement since being in MAU, denies LOF,  vaginal itching/burning, urinary symptoms, h/a, dizziness, n/v, or fever/chills. Was seen in MAU on 2/20 and treated for GC and Chlamydia, she reports watery discharge since treatment with Rocephin and Zithromax. She has not been seen for prenatal care since January 29th.   Past Medical History: Past Medical History:  Diagnosis Date  . Asthma   . Gonorrhea     Past obstetric history: OB History  Gravida Para Term Preterm AB Living  6 0     5    SAB TAB Ectopic Multiple Live Births  2 3          # Outcome Date GA Lbr Len/2nd Weight Sex Delivery Anes PTL Lv  6 Current           5 SAB 03/15/17 2853w0d         4 SAB 10/27/16 7949w0d         3 TAB           2 TAB           1 TAB             Obstetric Comments  G1: 2010 EAB via D&C 8633m  G2: 2012 EAB 8wks pills  G3: unsure date 14wks EAB via D&C  G4: unsure date 9wks EAB pills  G5: 2018 missed AB at 8wks    Past Surgical History: Past Surgical History:  Procedure Laterality Date  . DILATION AND CURETTAGE OF UTERUS     x2 for EAB    Family History: Family History  Problem Relation Age of Onset  . Hypertension Mother   . Diabetes Maternal Grandmother   . Hypertension Maternal  Grandmother   . Alcohol abuse Neg Hx   . Arthritis Neg Hx   . Asthma Neg Hx   . Birth defects Neg Hx   . Cancer Neg Hx   . COPD Neg Hx   . Depression Neg Hx   . Drug abuse Neg Hx   . Early death Neg Hx   . Hearing loss Neg Hx   . Heart disease Neg Hx   . Hyperlipidemia Neg Hx   . Kidney disease Neg Hx   . Learning disabilities Neg Hx   . Mental illness Neg Hx   . Mental retardation Neg Hx   . Miscarriages / Stillbirths Neg Hx   . Stroke Neg Hx   . Vision loss Neg Hx   . Varicose Veins Neg Hx     Social History: Social History   Tobacco Use  . Smoking status: Never Smoker  . Smokeless tobacco: Never Used  Substance Use Topics  . Alcohol use: No    Frequency: Never  Comment: none with pregnancy  . Drug use: Yes    Types: Marijuana    Comment: 3-4 mo ago    Allergies:  Allergies  Allergen Reactions  . Aspirin Swelling and Other (See Comments)    Reaction:  Tongue swelling/blurry vision     Meds:  Medications Prior to Admission  Medication Sig Dispense Refill Last Dose  . albuterol (PROVENTIL HFA;VENTOLIN HFA) 108 (90 Base) MCG/ACT inhaler Inhale 2 puffs into the lungs every 6 (six) hours as needed for wheezing or shortness of breath.   Past Month at Unknown time  . metroNIDAZOLE (FLAGYL) 500 MG tablet Take 1 tablet (500 mg total) by mouth 2 (two) times daily. 14 tablet 0 03/17/2018 at Unknown time  . Prenatal Vit-Fe Fumarate-FA (PRENATAL MULTIVITAMIN) TABS tablet Take 1 tablet by mouth every evening. 30 tablet 12 Past Week at Unknown time  . acetaminophen (TYLENOL) 500 MG tablet Take 1,000 mg by mouth every 6 (six) hours as needed for headache.   More than a month at Unknown time    ROS:  Review of Systems  Constitutional: Negative.   Respiratory: Negative.   Cardiovascular: Negative.   Gastrointestinal: Positive for abdominal pain. Negative for constipation, diarrhea, nausea and vomiting.  Genitourinary: Positive for vaginal bleeding and vaginal discharge.  Negative for difficulty urinating, dysuria, frequency, pelvic pain and urgency.  Musculoskeletal: Positive for back pain.  Neurological: Negative.   Psychiatric/Behavioral: Negative.    I have reviewed patient's Past Medical Hx, Surgical Hx, Family Hx, Social Hx, medications and allergies.   Physical Exam   Patient Vitals for the past 24 hrs:  BP Temp Temp src Pulse Resp  03/18/18 2208 102/71 98.5 F (36.9 C) Oral 85 17  03/18/18 1900 110/64 98.9 F (37.2 C) Oral 91 19   Constitutional: Well-developed, well-nourished female in no acute distress.  Cardiovascular: normal rate Respiratory: normal effort GI: Abd soft, non-tender, gravid appropriate for gestational age.  MS: Extremities nontender, no edema, normal ROM Neurologic: Alert and oriented x 4.  GU: Neg CVAT.  PELVIC EXAM: Cervix pink, visually open, without lesion, large amounts of gray discharge with odor, vaginal walls and external genitalia normal  CERVICAL EXAM: Dilation: Fingertip(1cm externally) Effacement (%): 40 Cervical Position: Middle Exam by:: Lanice Shirts CNM  FHT:  Baseline 135 , moderate variability, accelerations present, no decelerations Contractions: occasional contractions with UI   Labs: Results for orders placed or performed during the hospital encounter of 03/18/18 (from the past 24 hour(s))  Wet prep, genital     Status: Abnormal   Collection Time: 03/18/18  8:29 PM  Result Value Ref Range   Yeast Wet Prep HPF POC NONE SEEN NONE SEEN   Trich, Wet Prep NONE SEEN NONE SEEN   Clue Cells Wet Prep HPF POC NONE SEEN NONE SEEN   WBC, Wet Prep HPF POC MANY (A) NONE SEEN   Sperm NONE SEEN   Fetal fibronectin     Status: Abnormal   Collection Time: 03/18/18  8:29 PM  Result Value Ref Range   Fetal Fibronectin POSITIVE (A) NEGATIVE   O/Positive/-- (12/06 0000)  Imaging: CL: 2.6, is different from length on 1/25 which was 4.03 FHR 132  Presentation: Cephalic   MAU Course/MDM: Orders Placed This  Encounter  Procedures  . Wet prep, genital  . [place] MFM OB Transvaginal  . Fetal fibronectin  FFN- positive  Wet prep- negative  GC/C- pending   Meds ordered this encounter  Medications  . acetaminophen (TYLENOL) tablet 1,000 mg  .  Elastic Bandages & Supports (COMFORT FIT MATERNITY SUPP MED) MISC    Sig: 1 Device by Does not apply route daily.    Dispense:  1 each    Refill:  0    Order Specific Question:   Supervising Provider    Answer:   CONSTANT, PEGGY [[id number]]    NST reviewed- reactive    Treatments in MAU included 1000mg  of Tylenol for abdominal and back pain. Pt reports decreased pain to 3/10 with medication. Discussed with patient importance of going to prenatal appointments and not missing any more appointments. Pt verbalizes understanding.      Consult Dr Earlene Plater with presentation, exam findings, ultrasound results and test results. Dr Earlene Plater okay with discharge home with positive FFN and CL of 2.6. Recommends close follow up in the office this week for prenatal appointment.   Pt discharge with strict preterm labor precautions. Message sent to clinic for patient to have appointment in office on Wednesday, patient agrees to call office on Tuesday if she does not here from office by Monday afternoon.   Today's evaluation included a work-up for preterm labor which can be life-threatening for both mom and baby.  Assessment: 1. Pain of round ligament affecting pregnancy, antepartum   2. Encounter for antenatal screening for cervical length   3. Abdominal pain during pregnancy in second trimester   4. Limited prenatal care in second trimester     Plan: Discharge home. Pt stable prior to discharge.  Preterm Labor precautions and fetal kick counts Rx for maternity support belt  Will call patient if results of GC/C is positive  Make appointment for prenatal care this Wednesday    Allergies as of 03/18/2018      Reactions   Aspirin Swelling, Other (See Comments)   Reaction:   Tongue swelling/blurry vision       Medication List    STOP taking these medications   metroNIDAZOLE 500 MG tablet Commonly known as:  FLAGYL     TAKE these medications   acetaminophen 500 MG tablet Commonly known as:  TYLENOL Take 1,000 mg by mouth every 6 (six) hours as needed for headache.   albuterol 108 (90 Base) MCG/ACT inhaler Commonly known as:  PROVENTIL HFA;VENTOLIN HFA Inhale 2 puffs into the lungs every 6 (six) hours as needed for wheezing or shortness of breath.   COMFORT FIT MATERNITY SUPP MED Misc 1 Device by Does not apply route daily.   prenatal multivitamin Tabs tablet Take 1 tablet by mouth every evening.       Steward Drone Certified Nurse-Midwife 03/19/2018 4:06 AM

## 2018-03-18 NOTE — MAU Note (Signed)
Pt presents to MAU via EMS c/o lower abdominal pain that radiates to her side and back. Pt states the pain is a sharp throbbing pain that shoots in her abdomen and to her back, 5/10 on the pain scale. Pt reports light pink bleeding last night but also states last Wednesday she had bright red bleeding. Pt reports a runny fluid on Tuesday that was clear. Pt reports sexual intercourse on Monday and Tuesday morning. Pt reports unknown fetal movement today because she has not been counting due to work but recalls a kick last night. No other problems noted.

## 2018-03-19 LAB — GC/CHLAMYDIA PROBE AMP (~~LOC~~) NOT AT ARMC
Chlamydia: NEGATIVE
Neisseria Gonorrhea: NEGATIVE

## 2018-03-30 ENCOUNTER — Encounter (HOSPITAL_COMMUNITY): Payer: Self-pay | Admitting: *Deleted

## 2018-03-30 ENCOUNTER — Other Ambulatory Visit: Payer: Self-pay

## 2018-03-30 ENCOUNTER — Inpatient Hospital Stay (HOSPITAL_COMMUNITY)
Admission: AD | Admit: 2018-03-30 | Discharge: 2018-03-30 | Disposition: A | Discharge status: Home / Self Care | Payer: Medicaid Other | Source: Ambulatory Visit | Attending: Obstetrics and Gynecology | Admitting: Obstetrics and Gynecology

## 2018-03-30 ENCOUNTER — Telehealth: Payer: Self-pay | Admitting: Advanced Practice Midwife

## 2018-03-30 DIAGNOSIS — Z3A29 29 weeks gestation of pregnancy: Secondary | ICD-10-CM | POA: Diagnosis not present

## 2018-03-30 DIAGNOSIS — Z3483 Encounter for supervision of other normal pregnancy, third trimester: Secondary | ICD-10-CM

## 2018-03-30 DIAGNOSIS — Z3403 Encounter for supervision of normal first pregnancy, third trimester: Secondary | ICD-10-CM | POA: Insufficient documentation

## 2018-03-30 DIAGNOSIS — Z3480 Encounter for supervision of other normal pregnancy, unspecified trimester: Secondary | ICD-10-CM

## 2018-03-30 NOTE — Telephone Encounter (Signed)
Called patient to inform her about her about her appointment. Was not able to leave a message. Will send letter.

## 2018-03-30 NOTE — Discharge Instructions (Signed)

## 2018-03-30 NOTE — MAU Provider Note (Signed)
History     CSN: [id number]  Arrival date and time: 03/30/18 1005   First Provider Initiated Contact with Patient 03/30/18 1135      No chief complaint on file.  Lauren Mcconnell is a 27 y.o. G10P0090 at 1165w0d who presents today for a check up. She states that she was seen here a couple of weeks ago, and has not been seen since. She wants to make sure the baby is growing, and the lungs are developing. She denies any abdominal pain, contractions, VB or LOF. She reports normal fetal movement.     Past Medical History:  Diagnosis Date  . Asthma   . Gonorrhea     Past Surgical History:  Procedure Laterality Date  . DILATION AND CURETTAGE OF UTERUS     x2 for EAB    Family History  Problem Relation Age of Onset  . Hypertension Mother   . Diabetes Maternal Grandmother   . Hypertension Maternal Grandmother   . Alcohol abuse Neg Hx   . Arthritis Neg Hx   . Asthma Neg Hx   . Birth defects Neg Hx   . Cancer Neg Hx   . COPD Neg Hx   . Depression Neg Hx   . Drug abuse Neg Hx   . Early death Neg Hx   . Hearing loss Neg Hx   . Heart disease Neg Hx   . Hyperlipidemia Neg Hx   . Kidney disease Neg Hx   . Learning disabilities Neg Hx   . Mental illness Neg Hx   . Mental retardation Neg Hx   . Miscarriages / Stillbirths Neg Hx   . Stroke Neg Hx   . Vision loss Neg Hx   . Varicose Veins Neg Hx     Social History   Tobacco Use  . Smoking status: Never Smoker  . Smokeless tobacco: Never Used  Substance Use Topics  . Alcohol use: No    Frequency: Never    Comment: none with pregnancy  . Drug use: Not Currently    Types: Marijuana    Comment: 3-4 mo ago    Allergies:  Allergies  Allergen Reactions  . Aspirin Swelling and Other (See Comments)    Reaction:  Tongue swelling/blurry vision     Medications Prior to Admission  Medication Sig Dispense Refill Last Dose  . acetaminophen (TYLENOL) 500 MG tablet Take 1,000 mg by mouth every 6 (six) hours as needed for  headache.   More than a month at Unknown time  . albuterol (PROVENTIL HFA;VENTOLIN HFA) 108 (90 Base) MCG/ACT inhaler Inhale 2 puffs into the lungs every 6 (six) hours as needed for wheezing or shortness of breath.   Past Month at Unknown time  . Elastic Bandages & Supports (COMFORT FIT MATERNITY SUPP MED) MISC 1 Device by Does not apply route daily. 1 each 0   . Prenatal Vit-Fe Fumarate-FA (PRENATAL MULTIVITAMIN) TABS tablet Take 1 tablet by mouth every evening. 30 tablet 12 Past Week at Unknown time    Review of Systems  Constitutional: Negative for chills and fever.  Gastrointestinal: Negative for abdominal pain.  Genitourinary: Negative for pelvic pain, vaginal bleeding and vaginal discharge.   Physical Exam   Blood pressure 108/68, pulse 89, temperature 97.9 F (36.6 C), temperature source Oral, resp. rate 16, weight 152 lb 4 oz (69.1 kg), last menstrual period 09/08/2017, SpO2 100 %, unknown if currently breastfeeding.  Physical Exam  Nursing note and vitals reviewed. Constitutional: She is oriented to person,  place, and time. She appears well-developed and well-nourished. No distress.  HENT:  Head: Normocephalic.  Cardiovascular: Normal rate.  Respiratory: Effort normal.  GI: Soft. There is no tenderness. There is no rebound.  Neurological: She is alert and oriented to person, place, and time.  Skin: Skin is warm and dry.  Psychiatric: She has a normal mood and affect.   FHT: 150, moderate with 15x15 accels, no decels Toco: No UCs  MAU Course  Procedures  MDM DW patient that regular prenatal care is the best way to continue to check on baby and ensure appropriate development throughout the pregnancy. Patient to go downstairs to make an appointment.   Assessment and Plan   1. Supervision of other normal pregnancy, antepartum   2. [redacted] weeks gestation of pregnancy    DC home Comfort measures reviewed  3rd Trimester precautions  PTL precautions  Fetal kick counts RX:  none  Return to MAU as needed FU with OB as planned  Follow-up Information    [facility].   Specialty:  Obstetrics and Gynecology Why:  make an appointment as soon as possible  Contact information: [address] [place] [address] [phone]           Thressa Sheller 03/30/2018, 11:36 AM

## 2018-03-30 NOTE — Progress Notes (Signed)
Patient signed printed copy of AVS signature page.  Dc'd home in good condition.  Educated about fetal kick counts.  Denies having any questions as this time.

## 2018-03-30 NOTE — MAU Note (Signed)
Last 2 wks, came in by ambulance. Paperwork said something about preterm labor. Also said something about steroids, she was to call and make an appt.  She has not done that.  Wants to make sure the baby is growing right.not having any problems or pain since she was last here.

## 2018-04-02 ENCOUNTER — Encounter: Payer: Medicaid Other | Admitting: Advanced Practice Midwife

## 2018-04-12 ENCOUNTER — Inpatient Hospital Stay (HOSPITAL_COMMUNITY)
Admission: AD | Admit: 2018-04-12 | Discharge: 2018-04-12 | Disposition: A | Discharge status: Home / Self Care | Payer: Medicaid Other | Source: Ambulatory Visit | Attending: Family Medicine | Admitting: Family Medicine

## 2018-04-12 ENCOUNTER — Encounter (HOSPITAL_COMMUNITY): Payer: Self-pay | Admitting: *Deleted

## 2018-04-12 DIAGNOSIS — Z3A30 30 weeks gestation of pregnancy: Secondary | ICD-10-CM | POA: Insufficient documentation

## 2018-04-12 DIAGNOSIS — O26893 Other specified pregnancy related conditions, third trimester: Secondary | ICD-10-CM | POA: Insufficient documentation

## 2018-04-12 DIAGNOSIS — B9689 Other specified bacterial agents as the cause of diseases classified elsewhere: Secondary | ICD-10-CM

## 2018-04-12 DIAGNOSIS — N898 Other specified noninflammatory disorders of vagina: Secondary | ICD-10-CM

## 2018-04-12 DIAGNOSIS — N760 Acute vaginitis: Secondary | ICD-10-CM | POA: Insufficient documentation

## 2018-04-12 DIAGNOSIS — O9989 Other specified diseases and conditions complicating pregnancy, childbirth and the puerperium: Secondary | ICD-10-CM | POA: Diagnosis not present

## 2018-04-12 LAB — URINALYSIS, ROUTINE W REFLEX MICROSCOPIC
Bilirubin Urine: NEGATIVE
Glucose, UA: NEGATIVE mg/dL
Hgb urine dipstick: NEGATIVE
Ketones, ur: NEGATIVE mg/dL
LEUKOCYTES UA: NEGATIVE
NITRITE: NEGATIVE
PROTEIN: NEGATIVE mg/dL
Specific Gravity, Urine: 1.018 (ref 1.005–1.030)
pH: 6 (ref 5.0–8.0)

## 2018-04-12 LAB — WET PREP, GENITAL
Sperm: NONE SEEN
Trich, Wet Prep: NONE SEEN
Yeast Wet Prep HPF POC: NONE SEEN

## 2018-04-12 MED ORDER — METRONIDAZOLE 500 MG PO TABS: 500.0000 mg | ORAL_TABLET | Freq: Two times a day (BID) | ORAL | 0 refills | Status: DC

## 2018-04-12 NOTE — Discharge Instructions (Signed)
Bacterial Vaginosis Bacterial vaginosis is a vaginal infection that occurs when the normal balance of bacteria in the vagina is disrupted. It results from an overgrowth of certain bacteria. This is the most common vaginal infection among women ages 15-44. Because bacterial vaginosis increases your risk for STIs (sexually transmitted infections), getting treated can help reduce your risk for chlamydia, gonorrhea, herpes, and HIV (human immunodeficiency virus). Treatment is also important for preventing complications in pregnant women, because this condition can cause an early (premature) delivery. What are the causes? This condition is caused by an increase in harmful bacteria that are normally present in small amounts in the vagina. However, the reason that the condition develops is not fully understood. What increases the risk? The following factors may make you more likely to develop this condition:  Having a new sexual partner or multiple sexual partners.  Having unprotected sex.  Douching.  Having an intrauterine device (IUD).  Smoking.  Drug and alcohol abuse.  Taking certain antibiotic medicines.  Being pregnant.  You cannot get bacterial vaginosis from toilet seats, bedding, swimming pools, or contact with objects around you. What are the signs or symptoms? Symptoms of this condition include:  Grey or white vaginal discharge. The discharge can also be watery or foamy.  A fish-like odor with discharge, especially after sexual intercourse or during menstruation.  Itching in and around the vagina.  Burning or pain with urination.  Some women with bacterial vaginosis have no signs or symptoms. How is this diagnosed? This condition is diagnosed based on:  Your medical history.  A physical exam of the vagina.  Testing a sample of vaginal fluid under a microscope to look for a large amount of bad bacteria or abnormal cells. Your health care provider may use a cotton swab  or a small wooden spatula to collect the sample.  How is this treated? This condition is treated with antibiotics. These may be given as a pill, a vaginal cream, or a medicine that is put into the vagina (suppository). If the condition comes back after treatment, a second round of antibiotics may be needed. Follow these instructions at home: Medicines  Take over-the-counter and prescription medicines only as told by your health care provider.  Take or use your antibiotic as told by your health care provider. Do not stop taking or using the antibiotic even if you start to feel better. General instructions  If you have a female sexual partner, tell her that you have a vaginal infection. She should see her health care provider and be treated if she has symptoms. If you have a female sexual partner, he does not need treatment.  During treatment: ? Avoid sexual activity until you finish treatment. ? Do not douche. ? Avoid alcohol as directed by your health care provider. ? Avoid breastfeeding as directed by your health care provider.  Drink enough water and fluids to keep your urine clear or pale yellow.  Keep the area around your vagina and rectum clean. ? Wash the area daily with warm water. ? Wipe yourself from front to back after using the toilet.  Keep all follow-up visits as told by your health care provider. This is important. How is this prevented?  Do not douche.  Wash the outside of your vagina with warm water only.  Use protection when having sex. This includes latex condoms and dental dams.  Limit how many sexual partners you have. To help prevent bacterial vaginosis, it is best to have sex with just   one partner (monogamous).  Make sure you and your sexual partner are tested for STIs.  Wear cotton or cotton-lined underwear.  Avoid wearing tight pants and pantyhose, especially during summer.  Limit the amount of alcohol that you drink.  Do not use any products that  contain nicotine or tobacco, such as cigarettes and e-cigarettes. If you need help quitting, ask your health care provider.  Do not use illegal drugs. Where to find more information:  Centers for Disease Control and Prevention: www.cdc.gov/std  American Sexual Health Association (ASHA): www.ashastd.org  [place] Department of Health and Human Services, Office on Women's Health: www.womenshealth.gov/ or https://www.womenshealth.gov/a-z-topics/bacterial-vaginosis Contact a health care provider if:  Your symptoms do not improve, even after treatment.  You have more discharge or pain when urinating.  You have a fever.  You have pain in your abdomen.  You have pain during sex.  You have vaginal bleeding between periods. Summary  Bacterial vaginosis is a vaginal infection that occurs when the normal balance of bacteria in the vagina is disrupted.  Because bacterial vaginosis increases your risk for STIs (sexually transmitted infections), getting treated can help reduce your risk for chlamydia, gonorrhea, herpes, and HIV (human immunodeficiency virus). Treatment is also important for preventing complications in pregnant women, because the condition can cause an early (premature) delivery.  This condition is treated with antibiotic medicines. These may be given as a pill, a vaginal cream, or a medicine that is put into the vagina (suppository). This information is not intended to replace advice given to you by your health care provider. Make sure you discuss any questions you have with your health care provider. Document Released: 12/12/2005 Document Revised: 04/17/2017 Document Reviewed: 08/27/2016 Elsevier Interactive Patient Education  2018 [employer]  

## 2018-04-12 NOTE — MAU Provider Note (Addendum)
History     CSN: [id number]  Arrival date and time: 04/12/18 0950   None     Chief Complaint  Patient presents with  . Vaginal Discharge  . Rupture of Membranes   HPI 27 yo G10 P0090 at [redacted]w[redacted]d here for the evaluation of leakage of fluid. Patient reports noticing clear watery discharge on Tuesday which turned to a thicker white discharge yesterday. She noticed a wet spot on her leg when she was using the bathroom this morning which concerned her for leakage of fluid. She reports good fetal movement and irregular contractions. She denies vaginal bleeding.   OB History    Gravida  10   Para  0   Term  0   Preterm  0   AB  9   Living  0     SAB  3   TAB  6   Ectopic  0   Multiple  0   Live Births  0        Obstetric Comments  G1: 2010 EAB via D&C 26m G2: 2012 EAB 8wks pills G3: unsure date 14wks EAB via D&C G4: unsure date 9wks EAB pills G5: 2018 missed AB at 8wks        Past Medical History:  Diagnosis Date  . Asthma   . Gonorrhea     Past Surgical History:  Procedure Laterality Date  . DILATION AND CURETTAGE OF UTERUS     x2 for EAB    Family History  Problem Relation Age of Onset  . Hypertension Mother   . Diabetes Maternal Grandmother   . Hypertension Maternal Grandmother   . Alcohol abuse Neg Hx   . Arthritis Neg Hx   . Asthma Neg Hx   . Birth defects Neg Hx   . Cancer Neg Hx   . COPD Neg Hx   . Depression Neg Hx   . Drug abuse Neg Hx   . Early death Neg Hx   . Hearing loss Neg Hx   . Heart disease Neg Hx   . Hyperlipidemia Neg Hx   . Kidney disease Neg Hx   . Learning disabilities Neg Hx   . Mental illness Neg Hx   . Mental retardation Neg Hx   . Miscarriages / Stillbirths Neg Hx   . Stroke Neg Hx   . Vision loss Neg Hx   . Varicose Veins Neg Hx     Social History   Tobacco Use  . Smoking status: Never Smoker  . Smokeless tobacco: Never Used  Substance Use Topics  . Alcohol use: No    Frequency: Never    Comment:  none with pregnancy  . Drug use: Not Currently    Types: Marijuana    Comment: 3-4 mo ago    Allergies:  Allergies  Allergen Reactions  . Aspirin Swelling and Other (See Comments)    Reaction:  Tongue swelling/blurry vision     Medications Prior to Admission  Medication Sig Dispense Refill Last Dose  . albuterol (PROVENTIL HFA;VENTOLIN HFA) 108 (90 Base) MCG/ACT inhaler Inhale 2 puffs into the lungs every 6 (six) hours as needed for wheezing or shortness of breath.   Past Month at Unknown time  . Prenatal Vit-Fe Fumarate-FA (PRENATAL MULTIVITAMIN) TABS tablet Take 1 tablet by mouth every evening. 30 tablet 12 04/11/2018 at Unknown time  . Elastic Bandages & Supports (COMFORT FIT MATERNITY SUPP MED) MISC 1 Device by Does not apply route daily. 1 each 0  Review of Systems  See pertinent in HPI Physical Exam   Blood pressure 115/70, pulse 82, temperature 98.6 F (37 C), temperature source Oral, resp. rate 16, last menstrual period 09/08/2017, SpO2 100 %, unknown if currently breastfeeding.  Physical Exam GENERAL: Well-developed, well-nourished female in no acute distress.  ABDOMEN: Soft, nontender, gravid PELVIC: Normal external female genitalia. Vagina is pink and rugated.  Normal discharge, no pooling. Normal appearing cervix. No fluid visualized in vaginal vault with valsalva. Cervix is closed and long EXTREMITIES: No cyanosis, clubbing, or edema, 2+ distal pulses.  FHT: baseline 130, mod variability, + 10 x 10 accels  MAU Course  Procedures  MDM Negative ferning Results for orders placed or performed during the hospital encounter of 04/12/18 (from the past 24 hour(s))  Urinalysis, Routine w reflex microscopic     Status: Abnormal   Collection Time: 04/12/18  9:55 AM  Result Value Ref Range   Color, Urine YELLOW YELLOW   APPearance CLOUDY (A) CLEAR   Specific Gravity, Urine 1.018 1.005 - 1.030   pH 6.0 5.0 - 8.0   Glucose, UA NEGATIVE NEGATIVE mg/dL   Hgb urine  dipstick NEGATIVE NEGATIVE   Bilirubin Urine NEGATIVE NEGATIVE   Ketones, ur NEGATIVE NEGATIVE mg/dL   Protein, ur NEGATIVE NEGATIVE mg/dL   Nitrite NEGATIVE NEGATIVE   Leukocytes, UA NEGATIVE NEGATIVE  Wet prep, genital     Status: Abnormal   Collection Time: 04/12/18 10:52 AM  Result Value Ref Range   Yeast Wet Prep HPF POC NONE SEEN NONE SEEN   Trich, Wet Prep NONE SEEN NONE SEEN   Clue Cells Wet Prep HPF POC PRESENT (A) NONE SEEN   WBC, Wet Prep HPF POC MODERATE (A) NONE SEEN   Sperm NONE SEEN      Assessment and Plan  27 yo G10P0090 at 3582w6d with BV - Reassurance provided on the absence of PPROM - Patient informed of BV diagnosis- Rx Flagyl provided - Follow up as scheduled for routine prenatal care - preterm labor precautions reviewed Ison Wichmann 04/12/2018, 11:04 AM

## 2018-04-12 NOTE — MAU Note (Signed)
Patient states she has been leaking clear fluid since Tuesday and now having "creamy mucousy discharge."  Denies vaginal itching or burning.  States having intermittent sharp lower abdominal pain a few times a day 6/10.

## 2018-04-13 LAB — GC/CHLAMYDIA PROBE AMP (~~LOC~~) NOT AT ARMC
CHLAMYDIA, DNA PROBE: NEGATIVE
NEISSERIA GONORRHEA: NEGATIVE

## 2018-04-18 ENCOUNTER — Telehealth: Payer: Self-pay | Admitting: General Practice

## 2018-04-18 NOTE — Telephone Encounter (Signed)
Patient missed appointment on 04/02/18.  Called and notified patient of appointment scheduled for 04/27/18 at 2:15pm.  Patient voiced understanding.

## 2018-04-27 ENCOUNTER — Encounter: Payer: Medicaid Other | Admitting: Student

## 2018-04-29 ENCOUNTER — Inpatient Hospital Stay (HOSPITAL_COMMUNITY)
Admission: AD | Admit: 2018-04-29 | Discharge: 2018-04-29 | Disposition: A | Discharge status: Home / Self Care | Payer: Medicaid Other | Source: Ambulatory Visit | Attending: Obstetrics & Gynecology | Admitting: Obstetrics & Gynecology

## 2018-04-29 ENCOUNTER — Encounter (HOSPITAL_COMMUNITY): Payer: Self-pay

## 2018-04-29 DIAGNOSIS — O0933 Supervision of pregnancy with insufficient antenatal care, third trimester: Secondary | ICD-10-CM | POA: Diagnosis not present

## 2018-04-29 DIAGNOSIS — Z3A33 33 weeks gestation of pregnancy: Secondary | ICD-10-CM | POA: Insufficient documentation

## 2018-04-29 DIAGNOSIS — R109 Unspecified abdominal pain: Secondary | ICD-10-CM | POA: Diagnosis present

## 2018-04-29 DIAGNOSIS — O0930 Supervision of pregnancy with insufficient antenatal care, unspecified trimester: Secondary | ICD-10-CM

## 2018-04-29 DIAGNOSIS — O4703 False labor before 37 completed weeks of gestation, third trimester: Secondary | ICD-10-CM | POA: Diagnosis not present

## 2018-04-29 LAB — GLUCOSE TOLERANCE, 1 HOUR: GLUCOSE 1 HOUR GTT: 129 mg/dL (ref 70–140)

## 2018-04-29 LAB — CBC
HCT: 31.9 % — ABNORMAL LOW (ref 36.0–46.0)
Hemoglobin: 10.7 g/dL — ABNORMAL LOW (ref 12.0–15.0)
MCH: 29.8 pg (ref 26.0–34.0)
MCHC: 33.5 g/dL (ref 30.0–36.0)
MCV: 88.9 fL (ref 78.0–100.0)
PLATELETS: 274 10*3/uL (ref 150–400)
RBC: 3.59 MIL/uL — ABNORMAL LOW (ref 3.87–5.11)
RDW: 12.8 % (ref 11.5–15.5)
WBC: 9 10*3/uL (ref 4.0–10.5)

## 2018-04-29 LAB — FETAL FIBRONECTIN: Fetal Fibronectin: NEGATIVE

## 2018-04-29 LAB — URINALYSIS, ROUTINE W REFLEX MICROSCOPIC
Bilirubin Urine: NEGATIVE
Glucose, UA: NEGATIVE mg/dL
HGB URINE DIPSTICK: NEGATIVE
KETONES UR: NEGATIVE mg/dL
LEUKOCYTES UA: NEGATIVE
NITRITE: NEGATIVE
PROTEIN: NEGATIVE mg/dL
Specific Gravity, Urine: 1.016 (ref 1.005–1.030)
pH: 6 (ref 5.0–8.0)

## 2018-04-29 LAB — GLUCOSE, CAPILLARY: GLUCOSE-CAPILLARY: 124 mg/dL — AB (ref 65–99)

## 2018-04-29 NOTE — MAU Note (Signed)
Pt drinking GlucoCrush 100g

## 2018-04-29 NOTE — MAU Provider Note (Signed)
CC:  Chief Complaint  Patient presents with  . Abdominal Pain     First Provider Initiated Contact with Patient 04/29/18 1033      HPI: Vanissa Strength is a 27 y.o. year old G80P0080 female at [redacted]w[redacted]d weeks gestation who presents to MAU reporting 3 contractions at work this morning.   Associated Sx: Positive for low abdominal pain and pressure.  Negative for urinary complaints or GI complaints. Vaginal bleeding: Denies Leaking of fluid: Denies Fetal movement: Normal  Limited prenatal care (2 visits) at [facility].  Frequent no-shows and cancellations.  Missed 28-week visit and labs.  Past Medical History:  Diagnosis Date  . Asthma   . Gonorrhea    OB History    Gravida  9   Para  0   Term  0   Preterm  0   AB  8   Living  0     SAB  3   TAB  5   Ectopic  0   Multiple  0   Live Births  0        Obstetric Comments  G1: 2010 EAB via D&C 37m G2: 2012 EAB 8wks pills G3: unsure date 14wks EAB via D&C G4: unsure date 9wks EAB pills G5: 2018 missed AB at 8wks       Social History   Socioeconomic History  . Marital status: Single    Spouse name: Not on file  . Number of children: Not on file  . Years of education: Not on file  . Highest education level: Not on file  Occupational History  . Not on file  Social Needs  . Financial resource strain: Not on file  . Food insecurity:    Worry: Not on file    Inability: Not on file  . Transportation needs:    Medical: Not on file    Non-medical: Not on file  Tobacco Use  . Smoking status: Never Smoker  . Smokeless tobacco: Never Used  Substance and Sexual Activity  . Alcohol use: No    Frequency: Never    Comment: none with pregnancy  . Drug use: Not Currently    Types: Marijuana    Comment: 3-4 mo ago  . Sexual activity: Not Currently    Birth control/protection: None  Lifestyle  . Physical activity:    Days per week: Not on file    Minutes per session: Not on  file  . Stress: Not on file  Relationships  . Social connections:    Talks on phone: Not on file    Gets together: Not on file    Attends religious service: Not on file    Active member of club or organization: Not on file    Attends meetings of clubs or organizations: Not on file    Relationship status: Not on file  . Intimate partner violence:    Fear of current or ex partner: Not on file    Emotionally abused: Not on file    Physically abused: Not on file    Forced sexual activity: Not on file  Other Topics Concern  . Not on file  Social History Narrative  . Not on file   Past Surgical History:  Procedure Laterality Date  . DILATION AND CURETTAGE OF UTERUS     x2 for EAB    O:  Patient Vitals for the past 24 hrs:  BP Temp Temp src Pulse Resp Height Weight  04/29/18 1026 107/69  98.5 F (36.9 C) Oral 93 18 - -  04/29/18 1007 - - - - -  (1.575 m) 157 lb (71.2 kg)    General: NAD Heart: Regular rate Lungs: Normal rate and effort Abd: Soft, NT, Gravid, S=D Pelvic: NEFG, neg pooling, negative blood.  Dilation: Closed Effacement (%): Thick Cervical Position: Posterior Exam by:: v Iraida Cragin cnm  EFM: 135, Moderate variability, 15 x 15 accelerations, no decelerations Toco: Contractions rare, mild  Results for orders placed or performed during the hospital encounter of 04/29/18 (from the past 24 hour(s))  Urinalysis, Routine w reflex microscopic     Status: Abnormal   Collection Time: 04/29/18 10:05 AM  Result Value Ref Range   Color, Urine YELLOW YELLOW   APPearance HAZY (A) CLEAR   Specific Gravity, Urine 1.016 1.005 - 1.030   pH 6.0 5.0 - 8.0   Glucose, UA NEGATIVE NEGATIVE mg/dL   Hgb urine dipstick NEGATIVE NEGATIVE   Bilirubin Urine NEGATIVE NEGATIVE   Ketones, ur NEGATIVE NEGATIVE mg/dL   Protein, ur NEGATIVE NEGATIVE mg/dL   Nitrite NEGATIVE NEGATIVE   Leukocytes, UA NEGATIVE NEGATIVE  Fetal fibronectin     Status: None   Collection Time: 04/29/18  10:40 AM  Result Value Ref Range   Fetal Fibronectin NEGATIVE NEGATIVE  Glucose, capillary     Status: Abnormal   Collection Time: 04/29/18 11:48 AM  Result Value Ref Range   Glucose-Capillary 124 (H) 65 - 99 mg/dL     Orders Placed This Encounter  Procedures  . Urinalysis, Routine w reflex microscopic  . Glucose tolerance, 1 hour  . CBC  . HIV antibody  . RPR  . Fetal fibronectin  . Glucose, capillary   No orders of the defined types were placed in this encounter.  MDM -Preterm contractions not evidence of active preterm labor.  Negative fetal fibronectin.  -Limited prenatal care.  Emphasized the importance of prenatal care and that maternity admissions cannot provide the same care she would receive in the office.  28-week labs done today.  Patient instructed to call and schedule appointment in 1 week.  In basket message sent to [facility] schedulers as well.  A: [redacted]w[redacted]d week IUP Preterm contractions FHR reactive  P: Discharge home in stable condition. Preterm labor precautions and fetal kick counts. Call [facility] to schedule prenatal visit in 1 week or sooner as needed if symptoms worsen.  Strongly encouraged patient to keep prenatal visits. Return to maternity admissions as needed if symptoms worsen.  Katrinka Blazing, [place], CNM 04/29/2018 11:58 AM  3

## 2018-04-29 NOTE — MAU Note (Signed)
Was at work and started having sharp pains from top of abdomen to lower abdomen. States abdomen was hard. Has been intermittent since. No vaginal bleeding, no LOF. +FM

## 2018-04-29 NOTE — Discharge Instructions (Signed)
Braxton Hicks Contractions °Contractions of the uterus can occur throughout pregnancy, but they are not always a sign that you are in labor. You may have practice contractions called Braxton Hicks contractions. These false labor contractions are sometimes confused with true labor. °What are Braxton Hicks contractions? °Braxton Hicks contractions are tightening movements that occur in the muscles of the uterus before labor. Unlike true labor contractions, these contractions do not result in opening (dilation) and thinning of the cervix. Toward the end of pregnancy (32-34 weeks), Braxton Hicks contractions can happen more often and may become stronger. These contractions are sometimes difficult to tell apart from true labor because they can be very uncomfortable. You should not feel embarrassed if you go to the hospital with false labor. °Sometimes, the only way to tell if you are in true labor is for your health care provider to look for changes in the cervix. The health care provider will do a physical exam and may monitor your contractions. If you are not in true labor, the exam should show that your cervix is not dilating and your water has not broken. °If there are other health problems associated with your pregnancy, it is completely safe for you to be sent home with false labor. You may continue to have Braxton Hicks contractions until you go into true labor. °How to tell the difference between true labor and false labor °True labor °· Contractions last 30-70 seconds. °· Contractions become very regular. °· Discomfort is usually felt in the top of the uterus, and it spreads to the lower abdomen and low back. °· Contractions do not go away with walking. °· Contractions usually become more intense and increase in frequency. °· The cervix dilates and gets thinner. °False labor °· Contractions are usually shorter and not as strong as true labor contractions. °· Contractions are usually irregular. °· Contractions  are often felt in the front of the lower abdomen and in the groin. °· Contractions may go away when you walk around or change positions while lying down. °· Contractions get weaker and are shorter-lasting as time goes on. °· The cervix usually does not dilate or become thin. °Follow these instructions at home: °· Take over-the-counter and prescription medicines only as told by your health care provider. °· Keep up with your usual exercises and follow other instructions from your health care provider. °· Eat and drink lightly if you think you are going into labor. °· If Braxton Hicks contractions are making you uncomfortable: °? Change your position from lying down or resting to walking, or change from walking to resting. °? Sit and rest in a tub of warm water. °? Drink enough fluid to keep your urine pale yellow. Dehydration may cause these contractions. °? Do slow and deep breathing several times an hour. °· Keep all follow-up prenatal visits as told by your health care provider. This is important. °Contact a health care provider if: °· You have a fever. °· You have continuous pain in your abdomen. °Get help right away if: °· Your contractions become stronger, more regular, and closer together. °· You have fluid leaking or gushing from your vagina. °· You pass blood-tinged mucus (bloody show). °· You have bleeding from your vagina. °· You have low back pain that you never had before. °· You feel your baby’s head pushing down and causing pelvic pressure. °· Your baby is not moving inside you as much as it used to. °Summary °· Contractions that occur before labor are called Braxton   Hicks contractions, false labor, or practice contractions.  Braxton Hicks contractions are usually shorter, weaker, farther apart, and less regular than true labor contractions. True labor contractions usually become progressively stronger and regular and they become more frequent.  Manage discomfort from [facility] contractions by  changing position, resting in a warm bath, drinking plenty of water, or practicing deep breathing. This information is not intended to replace advice given to you by your health care provider. Make sure you discuss any questions you have with your health care provider. Document Released: 04/27/2017 Document Revised: 04/27/2017 Document Reviewed: 04/27/2017 Elsevier Interactive Patient Education  2018 [employer].     Prenatal Care WHAT IS PRENATAL CARE? Prenatal care is the process of caring for a pregnant woman before she gives birth. Prenatal care makes sure that she and her baby remain as healthy as possible throughout pregnancy. Prenatal care may be provided by a midwife, family practice health care provider, or a childbirth and pregnancy specialist (obstetrician). Prenatal care may include physical examinations, testing, treatments, and education on nutrition, lifestyle, and social support services. WHY IS PRENATAL CARE SO IMPORTANT? Early and consistent prenatal care increases the chance that you and your baby will remain healthy throughout your pregnancy. This type of care also decreases a babys risk of being born too early (prematurely), or being born smaller than expected (small for gestational age). Any underlying medical conditions you may have that could pose a risk during your pregnancy are discussed during prenatal care visits. You will also be monitored regularly for any new conditions that may arise during your pregnancy so they can be treated quickly and effectively. WHAT HAPPENS DURING PRENATAL CARE VISITS? Prenatal care visits may include the following: Discussion Tell your health care provider about any new signs or symptoms you have experienced since your last visit. These might include:  Nausea or vomiting.  Increased or decreased level of energy.  Difficulty sleeping.  Back or leg pain.  Weight changes.  Frequent urination.  Shortness of breath with physical  activity.  Changes in your skin, such as the development of a rash or itchiness.  Vaginal discharge or bleeding.  Feelings of excitement or nervousness.  Changes in your babys movements.  You may want to write down any questions or topics you want to discuss with your health care provider and bring them with you to your appointment. Examination During your first prenatal care visit, you will likely have a complete physical exam. Your health care provider will often examine your vagina, cervix, and the position of your uterus, as well as check your heart, lungs, and other body systems. As your pregnancy progresses, your health care provider will measure the size of your uterus and your babys position inside your uterus. He or she may also examine you for early signs of labor. Your prenatal visits may also include checking your blood pressure and, after about 10-12 weeks of pregnancy, listening to your babys heartbeat. Testing Regular testing often includes:  Urinalysis. This checks your urine for glucose, protein, or signs of infection.  Blood count. This checks the levels of white and red blood cells in your body.  Tests for sexually transmitted infections (STIs). Testing for STIs at the beginning of pregnancy is routinely done and is required in many states.  Antibody testing. You will be checked to see if you are immune to certain illnesses, such as rubella, that can affect a developing fetus.  Glucose screen. Around 24-28 weeks of pregnancy, your blood glucose  level will be checked for signs of gestational diabetes. Follow-up tests may be recommended.  Group B strep. This is a bacteria that is commonly found inside a womans vagina. This test will inform your health care provider if you need an antibiotic to reduce the amount of this bacteria in your body prior to labor and childbirth.  Ultrasound. Many pregnant women undergo an ultrasound screening around 18-20 weeks of pregnancy  to evaluate the health of the fetus and check for any developmental abnormalities.  HIV (human immunodeficiency virus) testing. Early in your pregnancy, you will be screened for HIV. If you are at high risk for HIV, this test may be repeated during your third trimester of pregnancy.  You may be offered other testing based on your age, personal or family medical history, or other factors. HOW OFTEN SHOULD I PLAN TO SEE MY HEALTH CARE PROVIDER FOR PRENATAL CARE? Your prenatal care check-up schedule depends on any medical conditions you have before, or develop during, your pregnancy. If you do not have any underlying medical conditions, you will likely be seen for checkups:  Monthly, during the first 6 months of pregnancy.  Twice a month during months 7 and 8 of pregnancy.  Weekly starting in the 9th month of pregnancy and until delivery.  If you develop signs of early labor or other concerning signs or symptoms, you may need to see your health care provider more often. Ask your health care provider what prenatal care schedule is best for you. WHAT CAN I DO TO KEEP MYSELF AND MY BABY AS HEALTHY AS POSSIBLE DURING MY PREGNANCY?  Take a prenatal vitamin containing 400 micrograms (0.4 mg) of folic acid every day. Your health care provider may also ask you to take additional vitamins such as iodine, vitamin D, iron, copper, and zinc.  Take 1500-2000 mg of calcium daily starting at your 20th week of pregnancy until you deliver your baby.  Make sure you are up to date on your vaccinations. Unless directed otherwise by your health care provider: ? You should receive a tetanus, diphtheria, and pertussis (Tdap) vaccination between the 27th and 36th week of your pregnancy, regardless of when your last Tdap immunization occurred. This helps protect your baby from whooping cough (pertussis) after he or she is born. ? You should receive an annual inactivated influenza vaccine (IIV) to help protect you and  your baby from influenza. This can be done at any point during your pregnancy.  Eat a well-rounded diet that includes: ? Fresh fruits and vegetables. ? Lean proteins. ? Calcium-rich foods such as milk, yogurt, hard cheeses, and dark, leafy greens. ? Whole grain breads.  Do noteat seafood high in mercury, including: ? Swordfish. ? Tilefish. ? Shark. ? King mackerel. ? More than 6 oz tuna per week.  Do not eat: ? Raw or undercooked meats or eggs. ? Unpasteurized foods, such as soft cheeses (brie, blue, or feta), juices, and milks. ? Lunch meats. ? Hot dogs that have not been heated until they are steaming.  Drink enough water to keep your urine clear or pale yellow. For many women, this may be 10 or more 8 oz glasses of water each day. Keeping yourself hydrated helps deliver nutrients to your baby and may prevent the start of pre-term uterine contractions.  Do not use any tobacco products including cigarettes, chewing tobacco, or electronic cigarettes. If you need help quitting, ask your health care provider.  Do not drink beverages containing alcohol. No safe level of  alcohol consumption during pregnancy has been determined.  Do not use any illegal drugs. These can harm your developing baby or cause a miscarriage.  Ask your health care provider or pharmacist before taking any prescription or over-the-counter medicines, herbs, or supplements.  Limit your caffeine intake to no more than 200 mg per day.  Exercise. Unless told otherwise by your health care provider, try to get 30 minutes of moderate exercise most days of the week. Do not  do high-impact activities, contact sports, or activities with a high risk of falling, such as horseback riding or downhill skiing.  Get plenty of rest.  Avoid anything that raises your body temperature, such as hot tubs and saunas.  If you own a cat, do not empty its litter box. Bacteria contained in cat feces can cause an infection called  toxoplasmosis. This can result in serious harm to the fetus.  Stay away from chemicals such as insecticides, lead, mercury, and cleaning or paint products that contain solvents.  Do not have any X-rays taken unless medically necessary.  Take a childbirth and breastfeeding preparation class. Ask your health care provider if you need a referral or recommendation.  This information is not intended to replace advice given to you by your health care provider. Make sure you discuss any questions you have with your health care provider. Document Released: 12/15/2003 Document Revised: 05/16/2016 Document Reviewed: 02/26/2014 Elsevier Interactive Patient Education  2017 [employer].

## 2018-04-30 LAB — HIV ANTIBODY (ROUTINE TESTING W REFLEX): HIV SCREEN 4TH GENERATION: NONREACTIVE

## 2018-04-30 LAB — RPR: RPR Ser Ql: NONREACTIVE

## 2018-05-02 ENCOUNTER — Ambulatory Visit (INDEPENDENT_AMBULATORY_CARE_PROVIDER_SITE_OTHER): Payer: Medicaid Other | Admitting: Medical

## 2018-05-02 ENCOUNTER — Encounter: Payer: Self-pay | Admitting: Medical

## 2018-05-02 VITALS — BP 112/74 | HR 82 | Wt 155.9 lb

## 2018-05-02 DIAGNOSIS — Z3480 Encounter for supervision of other normal pregnancy, unspecified trimester: Secondary | ICD-10-CM

## 2018-05-02 DIAGNOSIS — O0933 Supervision of pregnancy with insufficient antenatal care, third trimester: Secondary | ICD-10-CM

## 2018-05-02 NOTE — Progress Notes (Signed)
   PRENATAL VISIT NOTE  Subjective:  Anneka Studer is a 27 y.o. G9P0080 at [redacted]w[redacted]d being seen today for ongoing prenatal care.  She is currently monitored for the following issues for this low-risk pregnancy and has Supervision of other normal pregnancy, antepartum and Limited prenatal care on their problem list.  Patient reports no complaints.  Contractions: Not present. Vag. Bleeding: None.  Movement: Present. Denies leaking of fluid.   The following portions of the patient's history were reviewed and updated as appropriate: allergies, current medications, past family history, past medical history, past social history, past surgical history and problem list. Problem list updated.  Objective:   Vitals:   05/02/18 0915  BP: 112/74  Pulse: 82  Weight: 155 lb 14.4 oz (70.7 kg)    Fetal Status: Fetal Heart Rate (bpm): 137 Fundal Height: 34 cm Movement: Present     General:  Alert, oriented and cooperative. Patient is in no acute distress.  Skin: Skin is warm and dry. No rash noted.   Cardiovascular: Normal heart rate noted  Respiratory: Normal respiratory effort, no problems with respiration noted  Abdomen: Soft, gravid, appropriate for gestational age.  Pain/Pressure: Absent     Pelvic: Cervical exam deferred        Extremities: Normal range of motion.  Edema: None  Mental Status: Normal mood and affect. Normal behavior. Normal judgment and thought content.   Assessment and Plan:  Pregnancy: G9P0080 at [redacted]w[redacted]d  1. Supervision of other normal pregnancy, antepartum  2. Limited prenatal care in third trimester - minimal second trimester visits, states it is a work issue and she will try to come to her regular appointments  Preterm labor symptoms and general obstetric precautions including but not limited to vaginal bleeding, contractions, leaking of fluid and fetal movement were reviewed in detail with the patient. Please refer to After Visit Summary for other counseling  recommendations.  Return in about 2 weeks (around 05/16/2018) for LOB.   Vonzella Nipple, PA-C

## 2018-05-02 NOTE — Patient Instructions (Signed)

## 2018-05-16 ENCOUNTER — Ambulatory Visit (INDEPENDENT_AMBULATORY_CARE_PROVIDER_SITE_OTHER): Payer: Medicaid Other | Admitting: Medical

## 2018-05-16 ENCOUNTER — Other Ambulatory Visit (HOSPITAL_COMMUNITY)
Admission: RE | Admit: 2018-05-16 | Discharge: 2018-05-16 | Disposition: A | Discharge status: Home / Self Care | Payer: Medicaid Other | Source: Ambulatory Visit | Attending: Medical | Admitting: Medical

## 2018-05-16 VITALS — BP 108/68 | HR 99 | Wt 156.6 lb

## 2018-05-16 DIAGNOSIS — Z3483 Encounter for supervision of other normal pregnancy, third trimester: Secondary | ICD-10-CM | POA: Insufficient documentation

## 2018-05-16 DIAGNOSIS — Z3480 Encounter for supervision of other normal pregnancy, unspecified trimester: Secondary | ICD-10-CM

## 2018-05-16 DIAGNOSIS — B3731 Acute candidiasis of vulva and vagina: Secondary | ICD-10-CM

## 2018-05-16 DIAGNOSIS — B373 Candidiasis of vulva and vagina: Secondary | ICD-10-CM

## 2018-05-16 LAB — OB RESULTS CONSOLE GBS: STREP GROUP B AG: POSITIVE

## 2018-05-16 NOTE — Progress Notes (Signed)
   PRENATAL VISIT NOTE  Subjective:  Lauren Mcconnell is a 27 y.o. G9P0080 at [redacted]w[redacted]d being seen today for ongoing prenatal care.  She is currently monitored for the following issues for this low-risk pregnancy and has Supervision of other normal pregnancy, antepartum and Limited prenatal care on their problem list.  Patient reports no complaints.  Contractions: Irregular. Vag. Bleeding: None.  Movement: Present. Denies leaking of fluid.   The following portions of the patient's history were reviewed and updated as appropriate: allergies, current medications, past family history, past medical history, past social history, past surgical history and problem list. Problem list updated.  Objective:   Vitals:   05/16/18 1106  BP: 108/68  Pulse: 99  Weight: 156 lb 9.6 oz (71 kg)    Fetal Status: Fetal Heart Rate (bpm): 150 Fundal Height: 36 cm Movement: Present  Presentation: Vertex  General:  Alert, oriented and cooperative. Patient is in no acute distress.  Skin: Skin is warm and dry. No rash noted.   Cardiovascular: Normal heart rate noted  Respiratory: Normal respiratory effort, no problems with respiration noted  Abdomen: Soft, gravid, appropriate for gestational age.  Pain/Pressure: Present     Pelvic: Cervical exam performed Dilation: Closed Effacement (%): Thick Station: Ballotable  Extremities: Normal range of motion.  Edema: Trace  Mental Status: Normal mood and affect. Normal behavior. Normal judgment and thought content.   Assessment and Plan:  1. Encounter for supervision of other normal pregnancy in third trimester - No concerns, routine care - Reviewed labor precautions (see AVS) - SVE closed/thick/posterior - Culture, beta strep (group b only) - Cervicovaginal ancillary only  Preterm labor symptoms and general obstetric precautions including but not limited to vaginal bleeding, contractions, leaking of fluid and fetal movement were reviewed in detail with the  patient. Please refer to After Visit Summary for other counseling recommendations.  Return in about 1 week (around 05/23/2018) for LOB.  No future appointments.  Calvert Cantor, CNM  05/16/18  11:29 AM

## 2018-05-16 NOTE — MAU Note (Signed)
Cytology from [facility] lab called to say that tests previously ordered are unable to be processed with the swab which was collected and sent to [facility].

## 2018-05-16 NOTE — Patient Instructions (Addendum)
Vaginal delivery means that you will give birth by pushing your baby out of your birth canal (vagina). A team of health care providers will help you before, during, and after vaginal delivery. Birth experiences are unique for every woman and every pregnancy, and birth experiences vary depending on where you choose to give birth. What should I do to prepare for my baby's birth? Before your baby is born, it is important to talk with your health care provider about:  Your labor and delivery preferences. These may include: ? Medicines that you may be given. ? How you will manage your pain. This might include non-medical pain relief techniques or injectable pain relief such as epidural analgesia. ? How you and your baby will be monitored during labor and delivery. ? Who may be in the labor and delivery room with you. ? Your feelings about surgical delivery of your baby (cesarean delivery, or C-section) if this becomes necessary. ? Your feelings about receiving donated blood through an IV tube (blood transfusion) if this becomes necessary.  Whether you are able: ? To take pictures or videos of the birth. ? To eat during labor and delivery. ? To move around, walk, or change positions during labor and delivery.  What to expect after your baby is born, such as: ? Whether delayed umbilical cord clamping and cutting is offered. ? Who will care for your baby right after birth. ? Medicines or tests that may be recommended for your baby. ? Whether breastfeeding is supported in your hospital or birth center. ? How long you will be in the hospital or birth center.  How any medical conditions you have may affect your baby or your labor and delivery experience.  To prepare for your baby's birth, you should also:  Attend all of your health care visits before delivery (prenatal visits) as recommended by your health care provider. This is important.  Prepare your home for your baby's arrival. Make sure  that you have: ? Diapers. ? Baby clothing. ? Feeding equipment. ? Safe sleeping arrangements for you and your baby.  Install a car seat in your vehicle. Have your car seat checked by a certified car seat installer to make sure that it is installed safely.  Think about who will help you with your new baby at home for at least the first several weeks after delivery.  What can I expect when I arrive at the birth center or hospital? Once you are in labor and have been admitted into the hospital or birth center, your health care provider may:  Review your pregnancy history and any concerns you have.  Insert an IV tube into one of your veins. This is used to give you fluids and medicines.  Check your blood pressure, pulse, temperature, and heart rate (vital signs).  Check whether your bag of water (amniotic sac) has broken (ruptured).  Talk with you about your birth plan and discuss pain control options.  Monitoring Your health care provider may monitor your contractions (uterine monitoring) and your baby's heart rate (fetal monitoring). You may need to be monitored:  Often, but not continuously (intermittently).  All the time or for long periods at a time (continuously). Continuous monitoring may be needed if: ? You are taking certain medicines, such as medicine to relieve pain or make your contractions stronger. ? You have pregnancy or labor complications.  Monitoring may be done by:  Placing a special stethoscope or a handheld monitoring device on your abdomen to check your   baby's heartbeat, and feeling your abdomen for contractions. This method of monitoring does not continuously record your baby's heartbeat or your contractions.  Placing monitors on your abdomen (external monitors) to record your baby's heartbeat and the frequency and length of contractions. You may not have to wear external monitors all the time.  Placing monitors inside of your uterus (internal monitors) to  record your baby's heartbeat and the frequency, length, and strength of your contractions. ? Your health care provider may use internal monitors if he or she needs more information about the strength of your contractions or your baby's heart rate. ? Internal monitors are put in place by passing a thin, flexible wire through your vagina and into your uterus. Depending on the type of monitor, it may remain in your uterus or on your baby's head until birth. ? Your health care provider will discuss the benefits and risks of internal monitoring with you and will ask for your permission before inserting the monitors.  Telemetry. This is a type of continuous monitoring that can be done with external or internal monitors. Instead of having to stay in bed, you are able to move around during telemetry. Ask your health care provider if telemetry is an option for you.  Physical exam Your health care provider may perform a physical exam. This may include:  Checking whether your baby is positioned: ? With the head toward your vagina (head-down). This is most common. ? With the head toward the top of your uterus (head-up or breech). If your baby is in a breech position, your health care provider may try to turn your baby to a head-down position so you can deliver vaginally. If it does not seem that your baby can be born vaginally, your provider may recommend surgery to deliver your baby. In rare cases, you may be able to deliver vaginally if your baby is head-up (breech delivery). ? Lying sideways (transverse). Babies that are lying sideways cannot be delivered vaginally.  Checking your cervix to determine: ? Whether it is thinning out (effacing). ? Whether it is opening up (dilating). ? How low your baby has moved into your birth canal.  What are the three stages of labor and delivery?  Normal labor and delivery is divided into the following three stages: Stage 1  Stage 1 is the longest stage of labor,  and it can last for hours or days. Stage 1 includes: ? Early labor. This is when contractions may be irregular, or regular and mild. Generally, early labor contractions are more than 10 minutes apart. ? Active labor. This is when contractions get longer, more regular, more frequent, and more intense. ? The transition phase. This is when contractions happen very close together, are very intense, and may last longer than during any other part of labor.  Contractions generally feel mild, infrequent, and irregular at first. They get stronger, more frequent (about every 2-3 minutes), and more regular as you progress from early labor through active labor and transition.  Many women progress through stage 1 naturally, but you may need help to continue making progress. If this happens, your health care provider may talk with you about: ? Rupturing your amniotic sac if it has not ruptured yet. ? Giving you medicine to help make your contractions stronger and more frequent.  Stage 1 ends when your cervix is completely dilated to 4 inches (10 cm) and completely effaced. This happens at the end of the transition phase. Stage 2  Once your cervix  is completely effaced and dilated to 4 inches (10 cm), you may start to feel an urge to push. It is common for the body to naturally take a rest before feeling the urge to push, especially if you received an epidural or certain other pain medicines. This rest period may last for up to 1-2 hours, depending on your unique labor experience.  During stage 2, contractions are generally less painful, because pushing helps relieve contraction pain. Instead of contraction pain, you may feel stretching and burning pain, especially when the widest part of your baby's head passes through the vaginal opening (crowning).  Your health care provider will closely monitor your pushing progress and your baby's progress through the vagina during stage 2.  Your health care provider may  massage the area of skin between your vaginal opening and anus (perineum) or apply warm compresses to your perineum. This helps it stretch as the baby's head starts to crown, which can help prevent perineal tearing. ? In some cases, an incision may be made in your perineum (episiotomy) to allow the baby to pass through the vaginal opening. An episiotomy helps to make the opening of the vagina larger to allow more room for the baby to fit through.  It is very important to breathe and focus so your health care provider can control the delivery of your baby's head. Your health care provider may have you decrease the intensity of your pushing, to help prevent perineal tearing.  After delivery of your baby's head, the shoulders and the rest of the body generally deliver very quickly and without difficulty.  Once your baby is delivered, the umbilical cord may be cut right away, or this may be delayed for 1-2 minutes, depending on your baby's health. This may vary among health care providers, hospitals, and birth centers.  If you and your baby are healthy enough, your baby may be placed on your chest or abdomen to help maintain the baby's temperature and to help you bond with each other. Some mothers and babies start breastfeeding at this time. Your health care team will dry your baby and help keep your baby warm during this time.  Your baby may need immediate care if he or she: ? Showed signs of distress during labor. ? Has a medical condition. ? Was born too early (prematurely). ? Had a bowel movement before birth (meconium). ? Shows signs of difficulty transitioning from being inside the uterus to being outside of the uterus. If you are planning to breastfeed, your health care team will help you begin a feeding. Stage 3  The third stage of labor starts immediately after the birth of your baby and ends after you deliver the placenta. The placenta is an organ that develops during pregnancy to provide  oxygen and nutrients to your baby in the womb.  Delivering the placenta may require some pushing, and you may have mild contractions. Breastfeeding can stimulate contractions to help you deliver the placenta.  After the placenta is delivered, your uterus should tighten (contract) and become firm. This helps to stop bleeding in your uterus. To help your uterus contract and to control bleeding, your health care provider may: ? Give you medicine by injection, through an IV tube, by mouth, or through your rectum (rectally). ? Massage your abdomen or perform a vaginal exam to remove any blood clots that are left in your uterus. ? Empty your bladder by placing a thin, flexible tube (catheter) into your bladder. ? Encourage you to   breastfeed your baby. After labor is over, you and your baby will be monitored closely to ensure that you are both healthy until you are ready to go home. Your health care team will teach you how to care for yourself and your baby. This information is not intended to replace advice given to you by your health care provider. Make sure you discuss any questions you have with your health care provider. Document Released: 09/20/2008 Document Revised: 07/01/2016 Document Reviewed: 12/27/2015 Elsevier Interactive Patient Education  2018 [employer].  Fetal Movement Counts Patient Name: ________________________________________________ Patient Due Date: ____________________ What is a fetal movement count? A fetal movement count is the number of times that you feel your baby move during a certain amount of time. This may also be called a fetal kick count. A fetal movement count is recommended for every pregnant woman. You may be asked to start counting fetal movements as early as week 28 of your pregnancy. Pay attention to when your baby is most active. You may notice your baby's sleep and wake cycles. You may also notice things that make your baby move more. You should do a fetal  movement count:  When your baby is normally most active.  At the same time each day.  A good time to count movements is while you are resting, after having something to eat and drink. How do I count fetal movements? 1. Find a quiet, comfortable area. Sit, or lie down on your side. 2. Write down the date, the start time and stop time, and the number of movements that you felt between those two times. Take this information with you to your health care visits. 3. For 2 hours, count kicks, flutters, swishes, rolls, and jabs. You should feel at least 10 movements during 2 hours. 4. You may stop counting after you have felt 10 movements. 5. If you do not feel 10 movements in 2 hours, have something to eat and drink. Then, keep resting and counting for 1 hour. If you feel at least 4 movements during that hour, you may stop counting. Contact a health care provider if:  You feel fewer than 4 movements in 2 hours.  Your baby is not moving like he or she usually does. Date: ____________ Start time: ____________ Stop time: ____________ Movements: ____________ Date: ____________ Start time: ____________ Stop time: ____________ Movements: ____________ Date: ____________ Start time: ____________ Stop time: ____________ Movements: ____________ Date: ____________ Start time: ____________ Stop time: ____________ Movements: ____________ Date: ____________ Start time: ____________ Stop time: ____________ Movements: ____________ Date: ____________ Start time: ____________ Stop time: ____________ Movements: ____________ Date: ____________ Start time: ____________ Stop time: ____________ Movements: ____________ Date: ____________ Start time: ____________ Stop time: ____________ Movements: ____________ Date: ____________ Start time: ____________ Stop time: ____________ Movements: ____________ This information is not intended to replace advice given to you by your health care provider. Make sure you discuss any  questions you have with your health care provider. Document Released: 01/11/2007 Document Revised: 08/10/2016 Document Reviewed: 01/21/2016 Elsevier Interactive Patient Education  2018 [employer].  [employer] of the uterus can occur throughout pregnancy, but they are not always a sign that you are in labor. You may have practice contractions called Braxton Hicks contractions. These false labor contractions are sometimes confused with true labor. What are Deberah Pelton contractions? Braxton Hicks contractions are tightening movements that occur in the muscles of the uterus before labor. Unlike true labor contractions, these contractions do not result in opening (dilation) and  thinning of the cervix. Toward the end of pregnancy (32-34 weeks), Braxton Hicks contractions can happen more often and may become stronger. These contractions are sometimes difficult to tell apart from true labor because they can be very uncomfortable. You should not feel embarrassed if you go to the hospital with false labor. Sometimes, the only way to tell if you are in true labor is for your health care provider to look for changes in the cervix. The health care provider will do a physical exam and may monitor your contractions. If you are not in true labor, the exam should show that your cervix is not dilating and your water has not broken. If there are other health problems associated with your pregnancy, it is completely safe for you to be sent home with false labor. You may continue to have Braxton Hicks contractions until you go into true labor. How to tell the difference between true labor and false labor True labor  Contractions last 30-70 seconds.  Contractions become very regular.  Discomfort is usually felt in the top of the uterus, and it spreads to the lower abdomen and low back.  Contractions do not go away with walking.  Contractions usually become more intense and increase  in frequency.  The cervix dilates and gets thinner. False labor  Contractions are usually shorter and not as strong as true labor contractions.  Contractions are usually irregular.  Contractions are often felt in the front of the lower abdomen and in the groin.  Contractions may go away when you walk around or change positions while lying down.  Contractions get weaker and are shorter-lasting as time goes on.  The cervix usually does not dilate or become thin. Follow these instructions at home:  Take over-the-counter and prescription medicines only as told by your health care provider.  Keep up with your usual exercises and follow other instructions from your health care provider.  Eat and drink lightly if you think you are going into labor.  If Braxton Hicks contractions are making you uncomfortable: ? Change your position from lying down or resting to walking, or change from walking to resting. ? Sit and rest in a tub of warm water. ? Drink enough fluid to keep your urine pale yellow. Dehydration may cause these contractions. ? Do slow and deep breathing several times an hour.  Keep all follow-up prenatal visits as told by your health care provider. This is important. Contact a health care provider if:  You have a fever.  You have continuous pain in your abdomen. Get help right away if:  Your contractions become stronger, more regular, and closer together.  You have fluid leaking or gushing from your vagina.  You pass blood-tinged mucus (bloody show).  You have bleeding from your vagina.  You have low back pain that you never had before.  You feel your baby's head pushing down and causing pelvic pressure.  Your baby is not moving inside you as much as it used to. Summary  Contractions that occur before labor are called Braxton Hicks contractions, false labor, or practice contractions.  Braxton Hicks contractions are usually shorter, weaker, farther apart, and  less regular than true labor contractions. True labor contractions usually become progressively stronger and regular and they become more frequent.  Manage discomfort from [facility] contractions by changing position, resting in a warm bath, drinking plenty of water, or practicing deep breathing. This information is not intended to replace advice given to you by your health  care provider. Make sure you discuss any questions you have with your health care provider. Document Released: 04/27/2017 Document Revised: 04/27/2017 Document Reviewed: 04/27/2017 Elsevier Interactive Patient Education  2018 [employer].

## 2018-05-17 LAB — CERVICOVAGINAL ANCILLARY ONLY
Bacterial vaginitis: NEGATIVE
CHLAMYDIA, DNA PROBE: NEGATIVE
Candida vaginitis: POSITIVE — AB
NEISSERIA GONORRHEA: NEGATIVE
Trichomonas: NEGATIVE

## 2018-05-17 MED ORDER — TERCONAZOLE 0.8 % VA CREA: 1.0000 | TOPICAL_CREAM | Freq: Every day | VAGINAL | 0 refills | Status: DC

## 2018-05-17 NOTE — Addendum Note (Signed)
Addended by: Marny Lowenstein on: 05/17/2018 02:42 PM   Modules accepted: Orders

## 2018-05-19 LAB — CULTURE, BETA STREP (GROUP B ONLY): Strep Gp B Culture: POSITIVE — AB

## 2018-05-22 ENCOUNTER — Encounter: Payer: Self-pay | Admitting: Medical

## 2018-05-22 DIAGNOSIS — O98819 Other maternal infectious and parasitic diseases complicating pregnancy, unspecified trimester: Secondary | ICD-10-CM

## 2018-05-22 DIAGNOSIS — B951 Streptococcus, group B, as the cause of diseases classified elsewhere: Secondary | ICD-10-CM | POA: Insufficient documentation

## 2018-05-23 ENCOUNTER — Ambulatory Visit (INDEPENDENT_AMBULATORY_CARE_PROVIDER_SITE_OTHER): Payer: Medicaid Other | Admitting: Advanced Practice Midwife

## 2018-05-23 ENCOUNTER — Encounter: Payer: Self-pay | Admitting: Advanced Practice Midwife

## 2018-05-23 VITALS — BP 114/69 | HR 95 | Wt 161.5 lb

## 2018-05-23 DIAGNOSIS — Z3480 Encounter for supervision of other normal pregnancy, unspecified trimester: Secondary | ICD-10-CM

## 2018-05-23 NOTE — Progress Notes (Signed)
   PRENATAL VISIT NOTE  Subjective:  Lauren Mcconnell is a 27 y.o. G9P0080 at [redacted]w[redacted]d being seen today for ongoing prenatal care.  She is currently monitored for the following issues for this low-risk pregnancy and has Supervision of other normal pregnancy, antepartum; Limited prenatal care; and Group B streptococcal infection during pregnancy on their problem list.  Patient reports no complaints.  Contractions: Not present. Vag. Bleeding: None.  Movement: Present. Denies leaking of fluid.   The following portions of the patient's history were reviewed and updated as appropriate: allergies, current medications, past family history, past medical history, past social history, past surgical history and problem list. Problem list updated.  Objective:   Vitals:   05/23/18 1616  BP: 114/69  Pulse: 95  Weight: 161 lb 8 oz (73.3 kg)    Fetal Status: Fetal Heart Rate (bpm): 140 Fundal Height: 37 cm Movement: Present     General:  Alert, oriented and cooperative. Patient is in no acute distress.  Skin: Skin is warm and dry. No rash noted.   Cardiovascular: Normal heart rate noted  Respiratory: Normal respiratory effort, no problems with respiration noted  Abdomen: Soft, gravid, appropriate for gestational age.  Pain/Pressure: Present     Pelvic: Cervical exam deferred        Extremities: Normal range of motion.  Edema: Trace  Mental Status: Normal mood and affect. Normal behavior. Normal judgment and thought content.   Assessment and Plan:  Pregnancy: G9P0080 at [redacted]w[redacted]d  1. Supervision of other normal pregnancy, antepartum - Routine care   Term labor symptoms and general obstetric precautions including but not limited to vaginal bleeding, contractions, leaking of fluid and fetal movement were reviewed in detail with the patient. Please refer to After Visit Summary for other counseling recommendations.  Return in about 1 week (around 05/30/2018).   Thressa Sheller, CNM

## 2018-05-23 NOTE — Progress Notes (Signed)
Declined tdap today, but may take it at another visit.

## 2018-05-23 NOTE — Patient Instructions (Signed)
Vaginal delivery means that you will give birth by pushing your baby out of your birth canal (vagina). A team of health care providers will help you before, during, and after vaginal delivery. Birth experiences are unique for every woman and every pregnancy, and birth experiences vary depending on where you choose to give birth. What should I do to prepare for my baby's birth? Before your baby is born, it is important to talk with your health care provider about:  Your labor and delivery preferences. These may include: ? Medicines that you may be given. ? How you will manage your pain. This might include non-medical pain relief techniques or injectable pain relief such as epidural analgesia. ? How you and your baby will be monitored during labor and delivery. ? Who may be in the labor and delivery room with you. ? Your feelings about surgical delivery of your baby (cesarean delivery, or C-section) if this becomes necessary. ? Your feelings about receiving donated blood through an IV tube (blood transfusion) if this becomes necessary.  Whether you are able: ? To take pictures or videos of the birth. ? To eat during labor and delivery. ? To move around, walk, or change positions during labor and delivery.  What to expect after your baby is born, such as: ? Whether delayed umbilical cord clamping and cutting is offered. ? Who will care for your baby right after birth. ? Medicines or tests that may be recommended for your baby. ? Whether breastfeeding is supported in your hospital or birth center. ? How long you will be in the hospital or birth center.  How any medical conditions you have may affect your baby or your labor and delivery experience.  To prepare for your baby's birth, you should also:  Attend all of your health care visits before delivery (prenatal visits) as recommended by your health care provider. This is important.  Prepare your home for your baby's arrival. Make sure  that you have: ? Diapers. ? Baby clothing. ? Feeding equipment. ? Safe sleeping arrangements for you and your baby.  Install a car seat in your vehicle. Have your car seat checked by a certified car seat installer to make sure that it is installed safely.  Think about who will help you with your new baby at home for at least the first several weeks after delivery.  What can I expect when I arrive at the birth center or hospital? Once you are in labor and have been admitted into the hospital or birth center, your health care provider may:  Review your pregnancy history and any concerns you have.  Insert an IV tube into one of your veins. This is used to give you fluids and medicines.  Check your blood pressure, pulse, temperature, and heart rate (vital signs).  Check whether your bag of water (amniotic sac) has broken (ruptured).  Talk with you about your birth plan and discuss pain control options.  Monitoring Your health care provider may monitor your contractions (uterine monitoring) and your baby's heart rate (fetal monitoring). You may need to be monitored:  Often, but not continuously (intermittently).  All the time or for long periods at a time (continuously). Continuous monitoring may be needed if: ? You are taking certain medicines, such as medicine to relieve pain or make your contractions stronger. ? You have pregnancy or labor complications.  Monitoring may be done by:  Placing a special stethoscope or a handheld monitoring device on your abdomen to check your   baby's heartbeat, and feeling your abdomen for contractions. This method of monitoring does not continuously record your baby's heartbeat or your contractions.  Placing monitors on your abdomen (external monitors) to record your baby's heartbeat and the frequency and length of contractions. You may not have to wear external monitors all the time.  Placing monitors inside of your uterus (internal monitors) to  record your baby's heartbeat and the frequency, length, and strength of your contractions. ? Your health care provider may use internal monitors if he or she needs more information about the strength of your contractions or your baby's heart rate. ? Internal monitors are put in place by passing a thin, flexible wire through your vagina and into your uterus. Depending on the type of monitor, it may remain in your uterus or on your baby's head until birth. ? Your health care provider will discuss the benefits and risks of internal monitoring with you and will ask for your permission before inserting the monitors.  Telemetry. This is a type of continuous monitoring that can be done with external or internal monitors. Instead of having to stay in bed, you are able to move around during telemetry. Ask your health care provider if telemetry is an option for you.  Physical exam Your health care provider may perform a physical exam. This may include:  Checking whether your baby is positioned: ? With the head toward your vagina (head-down). This is most common. ? With the head toward the top of your uterus (head-up or breech). If your baby is in a breech position, your health care provider may try to turn your baby to a head-down position so you can deliver vaginally. If it does not seem that your baby can be born vaginally, your provider may recommend surgery to deliver your baby. In rare cases, you may be able to deliver vaginally if your baby is head-up (breech delivery). ? Lying sideways (transverse). Babies that are lying sideways cannot be delivered vaginally.  Checking your cervix to determine: ? Whether it is thinning out (effacing). ? Whether it is opening up (dilating). ? How low your baby has moved into your birth canal.  What are the three stages of labor and delivery?  Normal labor and delivery is divided into the following three stages: Stage 1  Stage 1 is the longest stage of labor,  and it can last for hours or days. Stage 1 includes: ? Early labor. This is when contractions may be irregular, or regular and mild. Generally, early labor contractions are more than 10 minutes apart. ? Active labor. This is when contractions get longer, more regular, more frequent, and more intense. ? The transition phase. This is when contractions happen very close together, are very intense, and may last longer than during any other part of labor.  Contractions generally feel mild, infrequent, and irregular at first. They get stronger, more frequent (about every 2-3 minutes), and more regular as you progress from early labor through active labor and transition.  Many women progress through stage 1 naturally, but you may need help to continue making progress. If this happens, your health care provider may talk with you about: ? Rupturing your amniotic sac if it has not ruptured yet. ? Giving you medicine to help make your contractions stronger and more frequent.  Stage 1 ends when your cervix is completely dilated to 4 inches (10 cm) and completely effaced. This happens at the end of the transition phase. Stage 2  Once your cervix   is completely effaced and dilated to 4 inches (10 cm), you may start to feel an urge to push. It is common for the body to naturally take a rest before feeling the urge to push, especially if you received an epidural or certain other pain medicines. This rest period may last for up to 1-2 hours, depending on your unique labor experience.  During stage 2, contractions are generally less painful, because pushing helps relieve contraction pain. Instead of contraction pain, you may feel stretching and burning pain, especially when the widest part of your baby's head passes through the vaginal opening (crowning).  Your health care provider will closely monitor your pushing progress and your baby's progress through the vagina during stage 2.  Your health care provider may  massage the area of skin between your vaginal opening and anus (perineum) or apply warm compresses to your perineum. This helps it stretch as the baby's head starts to crown, which can help prevent perineal tearing. ? In some cases, an incision may be made in your perineum (episiotomy) to allow the baby to pass through the vaginal opening. An episiotomy helps to make the opening of the vagina larger to allow more room for the baby to fit through.  It is very important to breathe and focus so your health care provider can control the delivery of your baby's head. Your health care provider may have you decrease the intensity of your pushing, to help prevent perineal tearing.  After delivery of your baby's head, the shoulders and the rest of the body generally deliver very quickly and without difficulty.  Once your baby is delivered, the umbilical cord may be cut right away, or this may be delayed for 1-2 minutes, depending on your baby's health. This may vary among health care providers, hospitals, and birth centers.  If you and your baby are healthy enough, your baby may be placed on your chest or abdomen to help maintain the baby's temperature and to help you bond with each other. Some mothers and babies start breastfeeding at this time. Your health care team will dry your baby and help keep your baby warm during this time.  Your baby may need immediate care if he or she: ? Showed signs of distress during labor. ? Has a medical condition. ? Was born too early (prematurely). ? Had a bowel movement before birth (meconium). ? Shows signs of difficulty transitioning from being inside the uterus to being outside of the uterus. If you are planning to breastfeed, your health care team will help you begin a feeding. Stage 3  The third stage of labor starts immediately after the birth of your baby and ends after you deliver the placenta. The placenta is an organ that develops during pregnancy to provide  oxygen and nutrients to your baby in the womb.  Delivering the placenta may require some pushing, and you may have mild contractions. Breastfeeding can stimulate contractions to help you deliver the placenta.  After the placenta is delivered, your uterus should tighten (contract) and become firm. This helps to stop bleeding in your uterus. To help your uterus contract and to control bleeding, your health care provider may: ? Give you medicine by injection, through an IV tube, by mouth, or through your rectum (rectally). ? Massage your abdomen or perform a vaginal exam to remove any blood clots that are left in your uterus. ? Empty your bladder by placing a thin, flexible tube (catheter) into your bladder. ? Encourage you to   breastfeed your baby. After labor is over, you and your baby will be monitored closely to ensure that you are both healthy until you are ready to go home. Your health care team will teach you how to care for yourself and your baby. This information is not intended to replace advice given to you by your health care provider. Make sure you discuss any questions you have with your health care provider. Document Released: 09/20/2008 Document Revised: 07/01/2016 Document Reviewed: 12/27/2015 Elsevier Interactive Patient Education  2018 [employer]  

## 2018-05-28 ENCOUNTER — Other Ambulatory Visit: Payer: Self-pay

## 2018-05-28 ENCOUNTER — Encounter (HOSPITAL_COMMUNITY): Payer: Self-pay | Admitting: Emergency Medicine

## 2018-05-28 ENCOUNTER — Emergency Department (HOSPITAL_COMMUNITY)
Admission: EM | Admit: 2018-05-28 | Discharge: 2018-05-28 | Disposition: A | Discharge status: Home / Self Care | Payer: Medicaid Other | Attending: Emergency Medicine | Admitting: Emergency Medicine

## 2018-05-28 DIAGNOSIS — S00512A Abrasion of oral cavity, initial encounter: Secondary | ICD-10-CM | POA: Diagnosis not present

## 2018-05-28 DIAGNOSIS — M79661 Pain in right lower leg: Secondary | ICD-10-CM | POA: Diagnosis not present

## 2018-05-28 DIAGNOSIS — R6884 Jaw pain: Secondary | ICD-10-CM | POA: Insufficient documentation

## 2018-05-28 DIAGNOSIS — Z23 Encounter for immunization: Secondary | ICD-10-CM | POA: Insufficient documentation

## 2018-05-28 DIAGNOSIS — O9989 Other specified diseases and conditions complicating pregnancy, childbirth and the puerperium: Secondary | ICD-10-CM | POA: Diagnosis present

## 2018-05-28 DIAGNOSIS — Y999 Unspecified external cause status: Secondary | ICD-10-CM | POA: Insufficient documentation

## 2018-05-28 DIAGNOSIS — Y929 Unspecified place or not applicable: Secondary | ICD-10-CM | POA: Insufficient documentation

## 2018-05-28 DIAGNOSIS — Z79899 Other long term (current) drug therapy: Secondary | ICD-10-CM | POA: Diagnosis not present

## 2018-05-28 DIAGNOSIS — J45909 Unspecified asthma, uncomplicated: Secondary | ICD-10-CM | POA: Diagnosis not present

## 2018-05-28 DIAGNOSIS — Y9389 Activity, other specified: Secondary | ICD-10-CM | POA: Insufficient documentation

## 2018-05-28 DIAGNOSIS — Z3A37 37 weeks gestation of pregnancy: Secondary | ICD-10-CM | POA: Diagnosis not present

## 2018-05-28 DIAGNOSIS — S0993XA Unspecified injury of face, initial encounter: Secondary | ICD-10-CM

## 2018-05-28 MED ORDER — TETANUS-DIPHTH-ACELL PERTUSSIS 5-2.5-18.5 LF-MCG/0.5 IM SUSP
0.5000 mL | Freq: Once | INTRAMUSCULAR | Status: AC
  Administered 2018-05-28: 0.5 mL via INTRAMUSCULAR
  Filled 2018-05-28: qty 0.5
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
## 2018-05-28 NOTE — Discharge Instructions (Addendum)
Rinse your mouth out with salt water after eating.  Please follow-up with your OB/GYN on Wednesday as scheduled.  You can take Tylenol as prescribed over-the-counter, as needed for pain.  You can use ice on your chin 3-4 times daily alternating 20 minutes on, 20 minutes off.  Please return to the emergency department if you develop any new or worsening symptoms.

## 2018-05-28 NOTE — ED Notes (Signed)
Rapid OB nurse exits room

## 2018-05-28 NOTE — ED Notes (Signed)
Rapid [facility] at bedside 
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
## 2018-06-02 ENCOUNTER — Inpatient Hospital Stay (HOSPITAL_COMMUNITY)
Admission: AD | Admit: 2018-06-02 | Discharge: 2018-06-05 | DRG: 807 | Disposition: A | Discharge status: Home / Self Care | Payer: Medicaid Other | Attending: Obstetrics & Gynecology | Admitting: Obstetrics & Gynecology

## 2018-06-02 ENCOUNTER — Encounter (HOSPITAL_COMMUNITY): Payer: Self-pay

## 2018-06-02 DIAGNOSIS — O99824 Streptococcus B carrier state complicating childbirth: Secondary | ICD-10-CM | POA: Diagnosis present

## 2018-06-02 DIAGNOSIS — J45909 Unspecified asthma, uncomplicated: Secondary | ICD-10-CM | POA: Diagnosis present

## 2018-06-02 DIAGNOSIS — Z3A38 38 weeks gestation of pregnancy: Secondary | ICD-10-CM

## 2018-06-02 DIAGNOSIS — O9952 Diseases of the respiratory system complicating childbirth: Secondary | ICD-10-CM | POA: Diagnosis present

## 2018-06-02 DIAGNOSIS — Z3483 Encounter for supervision of other normal pregnancy, third trimester: Secondary | ICD-10-CM | POA: Diagnosis present

## 2018-06-02 LAB — CBC
HCT: 36.2 % (ref 36.0–46.0)
Hemoglobin: 12 g/dL (ref 12.0–15.0)
MCH: 28.9 pg (ref 26.0–34.0)
MCHC: 33.1 g/dL (ref 30.0–36.0)
MCV: 87.2 fL (ref 78.0–100.0)
PLATELETS: 297 10*3/uL (ref 150–400)
RBC: 4.15 MIL/uL (ref 3.87–5.11)
RDW: 12.9 % (ref 11.5–15.5)
WBC: 9.4 10*3/uL (ref 4.0–10.5)

## 2018-06-02 LAB — TYPE AND SCREEN
ABO/RH(D): O POS
Antibody Screen: NEGATIVE

## 2018-06-02 LAB — POCT FERN TEST: POCT Fern Test: POSITIVE

## 2018-06-02 MED ORDER — SODIUM CHLORIDE 0.9 % IV SOLN
5.0000 10*6.[IU] | Freq: Once | INTRAVENOUS | Status: AC
  Administered 2018-06-02: 5 10*6.[IU] via INTRAVENOUS
  Filled 2018-06-02: qty 5

## 2018-06-02 MED ORDER — LIDOCAINE HCL (PF) 1 % IJ SOLN
30.0000 mL | INTRAMUSCULAR | Status: DC | PRN
  Filled 2018-06-02: qty 30

## 2018-06-02 MED ORDER — SOD CITRATE-CITRIC ACID 500-334 MG/5ML PO SOLN: 30.0000 mL | ORAL | Status: DC | PRN

## 2018-06-02 MED ORDER — FLEET ENEMA 7-19 GM/118ML RE ENEM: 1.0000 | ENEMA | RECTAL | Status: DC | PRN

## 2018-06-02 MED ORDER — LACTATED RINGERS IV SOLN
INTRAVENOUS | Status: DC
  Administered 2018-06-02 – 2018-06-03 (×4): via INTRAVENOUS

## 2018-06-02 MED ORDER — ACETAMINOPHEN 325 MG PO TABS: 650.0000 mg | ORAL_TABLET | ORAL | Status: DC | PRN

## 2018-06-02 MED ORDER — OXYTOCIN BOLUS FROM INFUSION
500.0000 mL | Freq: Once | INTRAVENOUS | Status: AC
  Administered 2018-06-03: 500 mL via INTRAVENOUS

## 2018-06-02 MED ORDER — OXYTOCIN 40 UNITS IN LACTATED RINGERS INFUSION - SIMPLE MED
2.5000 [IU]/h | INTRAVENOUS | Status: DC
  Filled 2018-06-02: qty 1000

## 2018-06-02 MED ORDER — PENICILLIN G POT IN DEXTROSE 60000 UNIT/ML IV SOLN
3.0000 10*6.[IU] | INTRAVENOUS | Status: DC
  Administered 2018-06-02 – 2018-06-03 (×3): 3 10*6.[IU] via INTRAVENOUS
  Filled 2018-06-02 (×8): qty 50

## 2018-06-02 MED ORDER — OXYCODONE-ACETAMINOPHEN 5-325 MG PO TABS: 1.0000 | ORAL_TABLET | ORAL | Status: DC | PRN

## 2018-06-02 MED ORDER — MISOPROSTOL 50MCG HALF TABLET
50.0000 ug | ORAL_TABLET | ORAL | Status: DC
  Administered 2018-06-02: 50 ug via ORAL
  Filled 2018-06-02 (×2): qty 1

## 2018-06-02 MED ORDER — OXYCODONE-ACETAMINOPHEN 5-325 MG PO TABS: 2.0000 | ORAL_TABLET | ORAL | Status: DC | PRN

## 2018-06-02 MED ORDER — FENTANYL CITRATE (PF) 100 MCG/2ML IJ SOLN
100.0000 ug | INTRAMUSCULAR | Status: DC | PRN
  Administered 2018-06-02 (×2): 100 ug via INTRAVENOUS
  Filled 2018-06-02 (×3): qty 2

## 2018-06-02 MED ORDER — LACTATED RINGERS IV SOLN
500.0000 mL | INTRAVENOUS | Status: DC | PRN
  Administered 2018-06-02: 300 mL via INTRAVENOUS
  Administered 2018-06-02 – 2018-06-03 (×3): 500 mL via INTRAVENOUS

## 2018-06-02 MED ORDER — ONDANSETRON HCL 4 MG/2ML IJ SOLN
4.0000 mg | Freq: Four times a day (QID) | INTRAMUSCULAR | Status: DC | PRN
  Administered 2018-06-02 – 2018-06-03 (×2): 4 mg via INTRAVENOUS
  Filled 2018-06-02 (×2): qty 2

## 2018-06-02 NOTE — H&P (Signed)
Lauren Mcconnell is a 27 y.o. female G9P0080 at [redacted]w[redacted]d admitted to [facility] after SROM at 1530 today. Reports occasional contractions that are "okay to breathe through". Denis vaginal bleeding, decreased fetal movement, fever, falls.   Patient Active Problem List   Diagnosis Date Noted  . Normal labor and delivery 06/02/2018  . Group B streptococcal infection during pregnancy 05/22/2018  . Limited prenatal care 04/29/2018  . Supervision of other normal pregnancy, antepartum 12/28/2017   . OB History    Gravida  9   Para  0   Term  0   Preterm  0   AB  8   Living  0     SAB  3   TAB  5   Ectopic  0   Multiple  0   Live Births  0        Obstetric Comments  G1: 2010 EAB via D&C 61m G2: 2012 EAB 8wks pills G3: unsure date 14wks EAB via D&C G4: unsure date 9wks EAB pills G5: 2018 missed AB at 8wks       Past Medical History:  Diagnosis Date  . Asthma   . Gonorrhea    Past Surgical History:  Procedure Laterality Date  . DILATION AND CURETTAGE OF UTERUS     x2 for EAB   Family History: family history includes Diabetes in her maternal grandmother; Hypertension in her maternal grandmother and mother. Social History:  reports that she has never smoked. She has never used smokeless tobacco. She reports that she has current or past drug history. Drug: Marijuana. She reports that she does not drink alcohol.  [facility] ([facility]) Prenatal Labs  Dating  edc 6/21 lmp=6wk u/s Blood type: O/Positive/-- (12/06 0000)  Genetic Screen 1 Screen: neg   AFP:  All normal       NIPS: Antibody:Negative (12/06 0000)  Anatomic [place]  normal, female  Rubella: Immune (12/06 0000)  GTT Early:   neg      71      Third trimester: Normal RPR: Nonreactive (12/06 0000)   Flu vaccine Received at hd HBsAg: Negative (12/06 0000)   TDaP vaccine      declined                                    Rhogam: O+ HIV: NR  Baby Food    Breast                                           GBS:   POSITIVE  Contraception none Pap: neg 2018  Circumcision Yes if a boy   Pediatrician List CF: neg  Support Person Pharmacist, hospital (sister) SMA  Prenatal Classes Info Hgb electrophoresis: neg     Maternal Diabetes: No Genetic Screening: Normal Maternal Ultrasounds/Referrals: Normal Fetal Ultrasounds or other Referrals:  None Maternal Substance Abuse:  No Significant Maternal Medications:  None Significant Maternal Lab Results:  None Other Comments:  None  Review of Systems  Constitutional: Negative for chills and fever.  Eyes: Negative for blurred vision.  Respiratory: Negative for shortness of breath and wheezing.   Gastrointestinal: Positive for abdominal pain. Negative for nausea and vomiting.  Neurological: Negative for weakness and headaches.  Psychiatric/Behavioral: Negative for depression, substance abuse and suicidal ideas. The patient is not nervous/anxious.  Maternal Medical History:  Reason for admission: Nausea.    Dilation: 1 Effacement (%): Thick Exam by:: Nichael Ehly cnm Blood pressure 110/89, pulse 86, temperature 97.9 F (36.6 C), temperature source Oral, resp. rate 20, height 5\' 2"  (1.575 m), weight 163 lb (73.9 kg), last menstrual period 09/08/2017, unknown if currently breastfeeding.   Maternal Exam:  Uterine Assessment: Contraction strength is mild.  Contraction duration is 1 minute. Contraction frequency is irregular.   Abdomen: Patient reports no abdominal tenderness. Fetal presentation: vertex  Introitus: Normal vulva. Normal vagina.  Amniotic fluid character: clear.  Cervix: Cervix evaluated by digital exam.     Fetal Exam Fetal Monitor Review: Mode: ultrasound.   Baseline rate: 140.  Variability: moderate (6-25 bpm).   Pattern: accelerations present and early decelerations.    Fetal State Assessment: Category I - tracings are normal.     Physical Exam  Nursing note and vitals reviewed. Constitutional: She is oriented to person, place,  and time. She appears well-developed and well-nourished.  HENT:  Head: Normocephalic and atraumatic.  Neck: Normal range of motion. Neck supple.  Cardiovascular: Normal rate, regular rhythm and normal heart sounds.  Respiratory: Effort normal and breath sounds normal.  GI:  Gravid  Genitourinary: Vagina normal and uterus normal.  Musculoskeletal: Normal range of motion.  Neurological: She is alert and oriented to person, place, and time. She has normal reflexes.  Skin: Skin is warm and dry.  Psychiatric: She has a normal mood and affect. Her behavior is normal. Judgment and thought content normal.    Prenatal labs: ABO, Rh: O/Positive/-- (12/06 0000) Antibody: Negative (12/06 0000) Rubella: Immune (12/06 0000) RPR: Non Reactive (05/05 1203)  HBsAg: Negative (12/06 0000)  HIV: Non Reactive (05/05 1203)  GBS: Positive (05/22 0000)   Assessment/Plan: --[age] y.o. G9P0080 at 4941w1d who presents after SROM at 1530 today --Unripe cervix, Cytotec buccal for ripening --GBS positive, PCN --Analgesia/anesthesia undecided, considering nitrous oxide/epidural/IV analgesia --Augment with Pitocin PRN overnight --Female/both --Does not desire contraception, previously had Mirena and was not satisfied --Expectant management, NSVD   Calvert CantorSamantha C Cortnie Ringel, CNM 06/02/2018, 5:56 PM

## 2018-06-02 NOTE — MAU Note (Signed)
Leaking since about 1530, clear fluid, no pain, no VB, +FM.

## 2018-06-02 NOTE — Progress Notes (Signed)
LABOR PROGRESS NOTE  Bryan Lemmanika Dass is a 27 y.o. G9P0080 at 6942w1d  admitted for SOL after SROM at 1530.   Subjective: Patient uncomfortable in bed and reports burning around IV site. Patient is not interested in receiving pitocin or other medications to augment labor. Patient was agreeable to FB placement. No other changes to report or complaints.  Objective: BP 128/71   Pulse 88   Temp 98.2 F (36.8 C) (Oral)   Resp 18   Ht 5\' 2"  (1.575 m)   Wt 163 lb (73.9 kg)   LMP 09/08/2017   BMI 29.81 kg/m  or  Vitals:   06/02/18 1853 06/02/18 1934 06/02/18 2145 06/02/18 2223  BP: 120/74 132/83 128/71   Pulse: 86 89 88   Resp: 16 18 18    Temp:    98.2 F (36.8 C)  TempSrc:    Oral  Weight:      Height:         Dilation: 3 Effacement (%): 60 Cervical Position: Posterior Station: -3 Presentation: Vertex Exam by:: Degele MD FHT: baseline: 135 bpm; variability: good (>6 bpm); accelerations: present; decelerations: intermittent variable;  Uterine activity: contractions present; irregular (2-3 min)  Labs: Lab Results  Component Value Date   WBC 9.4 06/02/2018   HGB 12.0 06/02/2018   HCT 36.2 06/02/2018   MCV 87.2 06/02/2018   PLT 297 06/02/2018    Patient Active Problem List   Diagnosis Date Noted  . Normal labor and delivery 06/02/2018  . Group B streptococcal infection during pregnancy 05/22/2018  . Limited prenatal care 04/29/2018  . Supervision of other normal pregnancy, antepartum 12/28/2017    Assessment / Plan: 27 y.o. G9P0080 at 7442w1d here for SOL after SROM.  Labor: s/p Cytotec x1; patient progressing well; patient did not want Pitocin; FB and more cytotec not necessary based on labor progression; continue expectant management Fetal Wellbeing:  Cat II; continue to monitor Pain Control:  S/p IV pain meds; pain moderately controlled currently; patient desires epidural closer to labor Anticipated MOD:  SVD ID: GBS Pos- IV penicillin G started  Cyndee Brightlyonnor  M Jazier Mcglamery,  Medical Student 6/8/201911:07 PM

## 2018-06-02 NOTE — MAU Provider Note (Addendum)
Pt informed that the ultrasound is considered a limited OB ultrasound and is not intended to be a complete ultrasound exam.  Patient also informed that the ultrasound is not being completed with the intent of assessing for fetal or placental anomalies or any pelvic abnormalities.  Explained that the purpose of today's ultrasound is to assess for  presentation.  Patient acknowledges the purpose of the exam and the limitations of the study.    Vertex presentation.   Duane Lopeasch, Jennifer I, NP 06/02/2018 4:16 PM

## 2018-06-03 ENCOUNTER — Encounter (HOSPITAL_COMMUNITY): Payer: Self-pay | Admitting: Anesthesiology

## 2018-06-03 ENCOUNTER — Inpatient Hospital Stay (HOSPITAL_COMMUNITY): Payer: Medicaid Other

## 2018-06-03 DIAGNOSIS — Z3A38 38 weeks gestation of pregnancy: Secondary | ICD-10-CM

## 2018-06-03 DIAGNOSIS — O99824 Streptococcus B carrier state complicating childbirth: Secondary | ICD-10-CM

## 2018-06-03 LAB — RPR: RPR: NONREACTIVE

## 2018-06-03 MED ORDER — TERBUTALINE SULFATE 1 MG/ML IJ SOLN
INTRAMUSCULAR | Status: AC
  Administered 2018-06-03: 0.25 mg via SUBCUTANEOUS
  Filled 2018-06-03: qty 1

## 2018-06-03 MED ORDER — EPHEDRINE 5 MG/ML INJ
10.0000 mg | INTRAVENOUS | Status: DC | PRN
  Filled 2018-06-03: qty 2

## 2018-06-03 MED ORDER — DIPHENHYDRAMINE HCL 25 MG PO CAPS: 25.0000 mg | ORAL_CAPSULE | Freq: Four times a day (QID) | ORAL | Status: DC | PRN

## 2018-06-03 MED ORDER — TETANUS-DIPHTH-ACELL PERTUSSIS 5-2.5-18.5 LF-MCG/0.5 IM SUSP: 0.5000 mL | Freq: Once | INTRAMUSCULAR | Status: DC

## 2018-06-03 MED ORDER — PHENYLEPHRINE 40 MCG/ML (10ML) SYRINGE FOR IV PUSH (FOR BLOOD PRESSURE SUPPORT)
80.0000 ug | PREFILLED_SYRINGE | INTRAVENOUS | Status: DC | PRN
  Filled 2018-06-03: qty 5
  Filled 2018-06-03: qty 10

## 2018-06-03 MED ORDER — WITCH HAZEL-GLYCERIN EX PADS: 1.0000 "application " | MEDICATED_PAD | CUTANEOUS | Status: DC | PRN

## 2018-06-03 MED ORDER — DIPHENHYDRAMINE HCL 50 MG/ML IJ SOLN: 12.5000 mg | INTRAMUSCULAR | Status: DC | PRN

## 2018-06-03 MED ORDER — FENTANYL CITRATE (PF) 100 MCG/2ML IJ SOLN
100.0000 ug | INTRAMUSCULAR | Status: DC | PRN
  Administered 2018-06-03: 100 ug via INTRAVENOUS

## 2018-06-03 MED ORDER — TERBUTALINE SULFATE 1 MG/ML IJ SOLN
0.2500 mg | Freq: Once | INTRAMUSCULAR | Status: AC | PRN
  Administered 2018-06-03: 0.25 mg via SUBCUTANEOUS

## 2018-06-03 MED ORDER — IBUPROFEN 600 MG PO TABS: 600.0000 mg | ORAL_TABLET | Freq: Four times a day (QID) | ORAL | Status: DC

## 2018-06-03 MED ORDER — PNEUMOCOCCAL VAC POLYVALENT 25 MCG/0.5ML IJ INJ
0.5000 mL | INJECTION | INTRAMUSCULAR | Status: DC
  Filled 2018-06-03: qty 0.5

## 2018-06-03 MED ORDER — ONDANSETRON HCL 4 MG/2ML IJ SOLN: 4.0000 mg | INTRAMUSCULAR | Status: DC | PRN

## 2018-06-03 MED ORDER — LACTATED RINGERS IV SOLN
500.0000 mL | Freq: Once | INTRAVENOUS | Status: AC
  Administered 2018-06-03: 03:00:00 via INTRAVENOUS

## 2018-06-03 MED ORDER — MEASLES, MUMPS & RUBELLA VAC ~~LOC~~ INJ: 0.5000 mL | INJECTION | Freq: Once | SUBCUTANEOUS | Status: DC

## 2018-06-03 MED ORDER — DIBUCAINE 1 % RE OINT: 1.0000 "application " | TOPICAL_OINTMENT | RECTAL | Status: DC | PRN

## 2018-06-03 MED ORDER — SIMETHICONE 80 MG PO CHEW: 80.0000 mg | CHEWABLE_TABLET | ORAL | Status: DC | PRN

## 2018-06-03 MED ORDER — ONDANSETRON HCL 4 MG PO TABS: 4.0000 mg | ORAL_TABLET | ORAL | Status: DC | PRN

## 2018-06-03 MED ORDER — ZOLPIDEM TARTRATE 5 MG PO TABS: 5.0000 mg | ORAL_TABLET | Freq: Every evening | ORAL | Status: DC | PRN

## 2018-06-03 MED ORDER — SENNOSIDES-DOCUSATE SODIUM 8.6-50 MG PO TABS
2.0000 | ORAL_TABLET | ORAL | Status: DC
  Administered 2018-06-03: 2 via ORAL
  Filled 2018-06-03 (×2): qty 2

## 2018-06-03 MED ORDER — OXYCODONE-ACETAMINOPHEN 5-325 MG PO TABS: 2.0000 | ORAL_TABLET | ORAL | Status: DC | PRN

## 2018-06-03 MED ORDER — LIDOCAINE HCL (PF) 1 % IJ SOLN
INTRAMUSCULAR | Status: DC | PRN
  Administered 2018-06-03: 5 mL via EPIDURAL
  Administered 2018-06-03: 8 mL via EPIDURAL

## 2018-06-03 MED ORDER — BENZOCAINE-MENTHOL 20-0.5 % EX AERO
1.0000 "application " | INHALATION_SPRAY | CUTANEOUS | Status: DC | PRN
  Administered 2018-06-03: 1 via TOPICAL
  Filled 2018-06-03: qty 56

## 2018-06-03 MED ORDER — PHENYLEPHRINE 40 MCG/ML (10ML) SYRINGE FOR IV PUSH (FOR BLOOD PRESSURE SUPPORT)
80.0000 ug | PREFILLED_SYRINGE | INTRAVENOUS | Status: DC | PRN
  Administered 2018-06-03: 80 ug via INTRAVENOUS
  Filled 2018-06-03: qty 5

## 2018-06-03 MED ORDER — COCONUT OIL OIL
1.0000 "application " | TOPICAL_OIL | Status: DC | PRN
  Administered 2018-06-03: 1 via TOPICAL
  Filled 2018-06-03: qty 120

## 2018-06-03 MED ORDER — ACETAMINOPHEN 325 MG PO TABS
650.0000 mg | ORAL_TABLET | ORAL | Status: DC | PRN
  Administered 2018-06-04 (×2): 650 mg via ORAL
  Filled 2018-06-03 (×3): qty 2

## 2018-06-03 MED ORDER — PRENATAL MULTIVITAMIN CH
1.0000 | ORAL_TABLET | Freq: Every day | ORAL | Status: DC
  Administered 2018-06-04: 1 via ORAL
  Filled 2018-06-03 (×2): qty 1

## 2018-06-03 MED ORDER — FENTANYL 2.5 MCG/ML BUPIVACAINE 1/10 % EPIDURAL INFUSION (WH - ANES)
14.0000 mL/h | INTRAMUSCULAR | Status: DC | PRN
  Administered 2018-06-03: 14 mL/h via EPIDURAL
  Filled 2018-06-03: qty 100

## 2018-06-03 MED ORDER — FERROUS SULFATE 325 (65 FE) MG PO TABS
325.0000 mg | ORAL_TABLET | Freq: Two times a day (BID) | ORAL | Status: DC
  Administered 2018-06-04 – 2018-06-05 (×3): 325 mg via ORAL
  Filled 2018-06-03 (×3): qty 1

## 2018-06-03 MED ORDER — MAGNESIUM HYDROXIDE 400 MG/5ML PO SUSP: 30.0000 mL | ORAL | Status: DC | PRN

## 2018-06-03 MED ORDER — OXYCODONE-ACETAMINOPHEN 5-325 MG PO TABS
1.0000 | ORAL_TABLET | ORAL | Status: DC | PRN
  Administered 2018-06-03: 1 via ORAL
  Filled 2018-06-03: qty 1

## 2018-06-03 NOTE — Anesthesia Postprocedure Evaluation (Signed)
Anesthesia Post Note  Patient: Lauren Mcconnell  Procedure(s) Performed: AN AD HOC LABOR EPIDURAL     Patient location during evaluation: Mother Baby Anesthesia Type: Epidural Level of consciousness: awake and alert and oriented Pain management: satisfactory to patient Vital Signs Assessment: post-procedure vital signs reviewed and stable Respiratory status: spontaneous breathing and nonlabored ventilation Cardiovascular status: stable Postop Assessment: no headache, no backache, no signs of nausea or vomiting, adequate PO intake, patient able to bend at knees and epidural receding (patient up walking) Anesthetic complications: no    Last Vitals:  Vitals:   06/03/18 1231 06/03/18 1313  BP: 103/81 113/79  Pulse: (!) 112 96  Resp:  16  Temp:  36.8 C  SpO2:      Last Pain:  Vitals:   06/03/18 1313  TempSrc: Oral  PainSc:    Pain Goal:                 Madison HickmanGREGORY,Hildagard Sobecki

## 2018-06-03 NOTE — Progress Notes (Signed)
LABOR PROGRESS NOTE  Bryan Lemmanika Osley is a 27 y.o. G9P0080 at 3341w2d  admitted for SOL after SROM at 1530.  Subjective: Patient is still uncomfortable in bed. Patient is agreeable to IUPC placement and amnioinfusion due to her variable decelerations. No other changes or complaints per patient.  Objective: BP 131/68   Pulse 97   Temp 98.2 F (36.8 C) (Oral)   Resp 18   Ht 5\' 2"  (1.575 m)   Wt 163 lb (73.9 kg)   LMP 09/08/2017   BMI 29.81 kg/m  or  Vitals:   06/03/18 0101 06/03/18 0131 06/03/18 0201 06/03/18 0232  BP: 104/73 121/60 131/79 131/68  Pulse: (!) 117 (!) 107 (!) 104 97  Resp: 18 18 18    Temp:      TempSrc:      Weight:      Height:         Dilation: 3 Effacement (%): 60 Cervical Position: Posterior Station: -3 Presentation: Vertex Exam by:: Degele MD FHT: baseline: 130 bpm; variability: good (>6 bpm); accelerations: present; decelerations: recurrent variable Uterine activity: contractions: present; irregular (2-3 min)  Labs: Lab Results  Component Value Date   WBC 9.4 06/02/2018   HGB 12.0 06/02/2018   HCT 36.2 06/02/2018   MCV 87.2 06/02/2018   PLT 297 06/02/2018    Patient Active Problem List   Diagnosis Date Noted  . Normal labor and delivery 06/02/2018  . Group B streptococcal infection during pregnancy 05/22/2018  . Limited prenatal care 04/29/2018  . Supervision of other normal pregnancy, antepartum 12/28/2017    Assessment / Plan: 27 y.o. G9P0080 at 5641w2d here for SOL after SROM.  Labor: s/p Cytotec x1; patient progressing slowly; IUPC placed to monitor contraction strength Fetal Wellbeing:  Cat II; begin amnioinfusion due to recurrent variable decelerations Pain Control:  S/p IV pain med; pain moderately controlled; epidural desired closer to labor Anticipated MOD:  SVD  Cyndee Brightlyonnor  M Tajee Savant, Medical Student 6/9/20192:53 AM

## 2018-06-03 NOTE — Anesthesia Preprocedure Evaluation (Signed)
Anesthesia Evaluation  Patient identified by MRN, date of birth, ID band Patient awake    Reviewed: Allergy & Precautions, H&P , NPO status , Patient's Chart, lab work & pertinent test results  Airway Mallampati: I  TM Distance: >3 FB Neck ROM: full    Dental no notable dental hx. (+) Teeth Intact   Pulmonary    Pulmonary exam normal breath sounds clear to auscultation       Cardiovascular negative cardio ROS Normal cardiovascular exam Rhythm:regular Rate:Normal     Neuro/Psych negative neurological ROS  negative psych ROS   GI/Hepatic negative GI ROS, Neg liver ROS,   Endo/Other  negative endocrine ROS  Renal/GU negative Renal ROS  negative genitourinary   Musculoskeletal negative musculoskeletal ROS (+)   Abdominal Normal abdominal exam  (+)   Peds  Hematology negative hematology ROS (+)   Anesthesia Other Findings   Reproductive/Obstetrics (+) Pregnancy                             Anesthesia Physical Anesthesia Plan  ASA: II  Anesthesia Plan: Epidural   Post-op Pain Management:    Induction:   PONV Risk Score and Plan:   Airway Management Planned:   Additional Equipment:   Intra-op Plan:   Post-operative Plan:   Informed Consent: I have reviewed the patients History and Physical, chart, labs and discussed the procedure including the risks, benefits and alternatives for the proposed anesthesia with the patient or authorized representative who has indicated his/her understanding and acceptance.       Plan Discussed with:   Anesthesia Plan Comments:         Anesthesia Quick Evaluation  

## 2018-06-03 NOTE — Anesthesia Procedure Notes (Signed)
Epidural Patient location during procedure: OB Start time: 06/03/2018 3:38 AM End time: 06/03/2018 3:42 AM  Staffing Anesthesiologist: Leilani AbleHatchett, Nashaly Dorantes, MD Performed: anesthesiologist   Preanesthetic Checklist Completed: patient identified, site marked, surgical consent, pre-op evaluation, timeout performed, IV checked, risks and benefits discussed and monitors and equipment checked  Epidural Patient position: sitting Prep: site prepped and draped and DuraPrep Patient monitoring: continuous pulse ox and blood pressure Approach: midline Location: L3-L4 Injection technique: LOR air  Needle:  Needle type: Tuohy  Needle gauge: 17 G Needle length: 9 cm and 9 Needle insertion depth: 5 cm cm Catheter type: closed end flexible Catheter size: 19 Gauge Catheter at skin depth: 10 cm Test dose: negative and Other  Assessment Sensory level: T9 Events: blood not aspirated, injection not painful, no injection resistance, negative IV test and no paresthesia  Additional Notes Reason for block:procedure for pain

## 2018-06-04 ENCOUNTER — Other Ambulatory Visit: Payer: Self-pay
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
## 2018-06-04 NOTE — Lactation Note (Signed)
This note was copied from a baby's chart. Lactation Consultation Note  Patient Name: Lauren Mcconnell Today's Date: 06/04/2018 Reason for consult: Initial assessment;Early term 37-38.6wks Breastfeeding consultation services and support information given to patient.  Mom very tired and was sleeping when I came in room.  Baby sleeping in crib.  She states baby is latching easily and denies concerns at this time.  Instructed to feed with cues and call for assist prn.  Maternal Data    Feeding    LATCH Score Latch: Too sleepy or reluctant, no latch achieved, no sucking elicited.  Audible Swallowing: (infant sleepy hand expression encouraged)              Interventions    Lactation Tools Discussed/Used     Consult Status Consult Status: Follow-up Date: 06/05/18 Follow-up type: In-patient    Huston FoleyMOULDEN, Taaj Hurlbut S 06/04/2018, 10:48 AM

## 2018-06-04 NOTE — Progress Notes (Signed)
POSTPARTUM PROGRESS NOTE  Post Partum Day 1 Subjective:  Lauren Mcconnell is a 27 y.o. [id number] 8341w2d s/p SVD.  No acute events overnight.  Pt denies problems with ambulating, voiding or po intake.  She denies nausea or vomiting.  Pain is moderately controlled.  She has had flatus. She has not had bowel movement.  Lochia Minimal. Pt reports left leg numbness/tingling when walking. She reported a HA and vision changes yesterday that have improved today. She denies N/V, CP, SOB, dizziness, abd pain.  Objective: Blood pressure 107/70, pulse 83, temperature 98.4 F (36.9 C), temperature source Axillary, resp. rate 18, height 5\' 2"  (1.575 m), weight 163 lb (73.9 kg), last menstrual period 09/08/2017, SpO2 96 %, unknown if currently breastfeeding.  Physical Exam:  General: alert, cooperative and no distress Lochia:normal flow Chest: CTAB Heart: RRR no m/r/g Abdomen: +BS, soft, nontender,  Uterine Fundus: firm, non-tender DVT Evaluation: No calf swelling or tenderness Extremities: no evidence of LE edema  Recent Labs    06/02/18 1635  HGB 12.0  HCT 36.2    Assessment/Plan:  ASSESSMENT: Lauren Mcconnell is a 27 y.o. [id number] 7541w2d s/p SVD.  Plan for discharge tomorrow Breastfeeding without issue Social Work consult Contraception : none Pt desires abdominal binder if possible   LOS: 2 days   Cyndee Brightlyonnor  M Ted Goodner, Medical Student 06/04/2018, 9:13 AM

## 2018-06-05 MED ORDER — IBUPROFEN 800 MG PO TABS: 800.0000 mg | ORAL_TABLET | Freq: Three times a day (TID) | ORAL | 0 refills | Status: DC | PRN
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
## 2018-06-05 NOTE — Lactation Note (Signed)
This note was copied from a baby's chart. Lactation Consultation Note  Patient Name: Lauren Mcconnell ZOXWR'UToday's Date: 06/05/2018 Reason for consult: Follow-up assessment;Other (Comment)(RN request)  P1 mother whose infant is now 1639 hours old.  RN request to assist with latch; not sure baby is feeding appropriately  Mother had baby latched in the cradle hold as I entered.  Baby had a shallow latch and dimpling was noted.  I offered to assist mother in a better position and she reluctantly agreed.  She also had baby clothed and swaddled.  I suggested removing the clothing and the swaddle for a better latch and she followed my advice, but it seemed like she would've preferred leaving her the way she was.  I thoroughly explained my reasoning for wanting a better position and a deeper latch.  I offered to help her get positioned more comfortably and she refused.  Instead of the cradle hold she agreed to try the cross cradle hold stating, "I don't like the football hold."  Assisted baby to latch onto right breast in the cross cradle hold without difficulty.  Showed mother how to obtain and maintain a deeper latch.  Lips flanged and no dimpling noted.  Audible swallows were heard.  Encouraged periodic breast compressions during feeding.  Mother had no pain with this latch. I observed her feeding for 10 minutes and I had to remind mother to keep baby in tight to the breast with cheeks and nose into breast tissue.    Mother was still feeding when I left at 10 minutes.  She will call for assistance as needed.     Maternal Data Formula Feeding for Exclusion: No Has patient been taught Hand Expression?: Yes Does the patient have breastfeeding experience prior to this delivery?: No  Feeding Feeding Type: Breast Fed  LATCH Score Latch: Grasps breast easily, tongue down, lips flanged, rhythmical sucking.  Audible Swallowing: Spontaneous and intermittent  Type of Nipple: Everted at rest and after  stimulation  Comfort (Breast/Nipple): Soft / non-tender  Hold (Positioning): Assistance needed to correctly position infant at breast and maintain latch.  LATCH Score: 9  Interventions Interventions: Breast feeding basics reviewed;Assisted with latch;Skin to skin;Breast massage;Hand express;Position options;Support pillows;Adjust position;Breast compression  Lactation Tools Discussed/Used     Consult Status Consult Status: Follow-up Date: 06/06/18    Irene PapBeth R Jemimah Cressy 06/05/2018, 3:17 AM

## 2018-06-05 NOTE — Lactation Note (Signed)
This note was copied from a baby's chart. Lactation Consultation Note  Patient Name: Lauren Mcconnell ZOXWR'UToday's Date: 06/05/2018 Reason for consult: Follow-up assessment Mom states feedings are going well and baby has been cluster feeding.  [facility] for good positioning and latch.  Discussed milk coming to volume and treatment of engorgement.  Manual pump given with instructions on use, cleaning and EBM storage.  Mom denies questions.  Lactation outpatient services and support information reviewed and encouraged prn.  Maternal Data    Feeding    LATCH Score                   Interventions    Lactation Tools Discussed/Used     Consult Status Consult Status: Complete Follow-up type: Call as needed    Huston FoleyMOULDEN, Tully Burgo S 06/05/2018, 9:01 AM

## 2018-06-05 NOTE — Discharge Instructions (Signed)
Vaginal Delivery, Care After °Refer to this sheet in the next few weeks. These instructions provide you with information about caring for yourself after vaginal delivery. Your health care provider may also give you more specific instructions. Your treatment has been planned according to current medical practices, but problems sometimes occur. Call your health care provider if you have any problems or questions. °What can I expect after the procedure? °After vaginal delivery, it is common to have: °· Some bleeding from your vagina. °· Soreness in your abdomen, your vagina, and the area of skin between your vaginal opening and your anus (perineum). °· Pelvic cramps. °· Fatigue. ° °Follow these instructions at home: °Medicines °· Take over-the-counter and prescription medicines only as told by your health care provider. °· If you were prescribed an antibiotic medicine, take it as told by your health care provider. Do not stop taking the antibiotic until it is finished. °Driving ° °· Do not drive or operate heavy machinery while taking prescription pain medicine. °· Do not drive for 24 hours if you received a sedative. °Lifestyle °· Do not drink alcohol. This is especially important if you are breastfeeding or taking medicine to relieve pain. °· Do not use tobacco products, including cigarettes, chewing tobacco, or e-cigarettes. If you need help quitting, ask your health care provider. °Eating and drinking °· Drink at least 8 eight-ounce glasses of water every day unless you are told not to by your health care provider. If you choose to breastfeed your baby, you may need to drink more water than this. °· Eat high-fiber foods every day. These foods may help prevent or relieve constipation. High-fiber foods include: °? Whole grain cereals and breads. °? Brown rice. °? Beans. °? Fresh fruits and vegetables. °Activity °· Return to your normal activities as told by your health care provider. Ask your health care provider  what activities are safe for you. °· Rest as much as possible. Try to rest or take a nap when your baby is sleeping. °· Do not lift anything that is heavier than your baby or 10 lb (4.5 kg) until your health care provider says that it is safe. °· Talk with your health care provider about when you can engage in sexual activity. This may depend on your: °? Risk of infection. °? Rate of healing. °? Comfort and desire to engage in sexual activity. °Vaginal Care °· If you have an episiotomy or a vaginal tear, check the area every day for signs of infection. Check for: °? More redness, swelling, or pain. °? More fluid or blood. °? Warmth. °? Pus or a bad smell. °· Do not use tampons or douches until your health care provider says this is safe. °· Watch for any blood clots that may pass from your vagina. These may look like clumps of dark red, brown, or black discharge. °General instructions °· Keep your perineum clean and dry as told by your health care provider. °· Wear loose, comfortable clothing. °· Wipe from front to back when you use the toilet. °· Ask your health care provider if you can shower or take a bath. If you had an episiotomy or a perineal tear during labor and delivery, your health care provider may tell you not to take baths for a certain length of time. °· Wear a bra that supports your breasts and fits you well. °· If possible, have someone help you with household activities and help care for your baby for at least a few days after   you leave the hospital. °· Keep all follow-up visits for you and your baby as told by your health care provider. This is important. °Contact a health care provider if: °· You have: °? Vaginal discharge that has a bad smell. °? Difficulty urinating. °? Pain when urinating. °? A sudden increase or decrease in the frequency of your bowel movements. °? More redness, swelling, or pain around your episiotomy or vaginal tear. °? More fluid or blood coming from your episiotomy or  vaginal tear. °? Pus or a bad smell coming from your episiotomy or vaginal tear. °? A fever. °? A rash. °? Little or no interest in activities you used to enjoy. °? Questions about caring for yourself or your baby. °· Your episiotomy or vaginal tear feels warm to the touch. °· Your episiotomy or vaginal tear is separating or does not appear to be healing. °· Your breasts are painful, hard, or turn red. °· You feel unusually sad or worried. °· You feel nauseous or you vomit. °· You pass large blood clots from your vagina. If you pass a blood clot from your vagina, save it to show to your health care provider. Do not flush blood clots down the toilet without having your health care provider look at them. °· You urinate more than usual. °· You are dizzy or light-headed. °· You have not breastfed at all and you have not had a menstrual period for 12 weeks after delivery. °· You have stopped breastfeeding and you have not had a menstrual period for 12 weeks after you stopped breastfeeding. °Get help right away if: °· You have: °? Pain that does not go away or does not get better with medicine. °? Chest pain. °? Difficulty breathing. °? Blurred vision or spots in your vision. °? Thoughts about hurting yourself or your baby. °· You develop pain in your abdomen or in one of your legs. °· You develop a severe headache. °· You faint. °· You bleed from your vagina so much that you fill two sanitary pads in one hour. °This information is not intended to replace advice given to you by your health care provider. Make sure you discuss any questions you have with your health care provider. °Document Released: 12/09/2000 Document Revised: 05/25/2016 Document Reviewed: 12/27/2015 °Elsevier Interactive Patient Education © 2018 Elsevier Inc. ° °

## 2018-06-19 ENCOUNTER — Telehealth: Payer: Self-pay

## 2018-06-19 NOTE — Telephone Encounter (Signed)
Patient called requesting a return to work note stating she has no restriction, but needs to pump as needed.  She asked the letter be emailed to [email or web address]

## 2018-06-20 ENCOUNTER — Encounter: Payer: Self-pay | Admitting: *Deleted

## 2018-06-20 NOTE — Telephone Encounter (Signed)
Toni Amendnika called again this am and left a message she is trying to get a letter to return to work. Per chart vaginal delivery 06/03/18. Called [place] to discuss how she is doing- left message I am returning your call and we can prepare a letter but would like to talk to you first- please call our office 1-5pm today

## 2018-06-20 NOTE — Telephone Encounter (Signed)
Pt called checking status of note. Pt states she wants to return to work July 3rd with no restrictions and stating pt needs to pump at work.

## 2018-06-20 NOTE — Telephone Encounter (Signed)
Called patient and she states she is ready to go back to work, denies any issues . I informed her she still needs to keep her scheduled pospartum appt on 07/16/18 but that she should be able to return to work and I will prepare a letter. Informed her I must review with a provider and  If they do not approve I will call her. I also informed her I can not mandate her employer to allow her to pump; but it is a [place] law and she can discuss with HR at her office. She voices understanding.

## 2018-07-15 ENCOUNTER — Encounter (HOSPITAL_COMMUNITY): Payer: Self-pay | Admitting: *Deleted

## 2018-07-15 ENCOUNTER — Inpatient Hospital Stay (HOSPITAL_COMMUNITY)
Admission: AD | Admit: 2018-07-15 | Discharge: 2018-07-15 | Disposition: A | Discharge status: Home / Self Care | Payer: Medicaid Other | Source: Ambulatory Visit | Attending: Obstetrics and Gynecology | Admitting: Obstetrics and Gynecology

## 2018-07-15 ENCOUNTER — Other Ambulatory Visit: Payer: Self-pay

## 2018-07-15 DIAGNOSIS — N760 Acute vaginitis: Secondary | ICD-10-CM | POA: Diagnosis not present

## 2018-07-15 DIAGNOSIS — B9689 Other specified bacterial agents as the cause of diseases classified elsewhere: Secondary | ICD-10-CM

## 2018-07-15 DIAGNOSIS — Z3202 Encounter for pregnancy test, result negative: Secondary | ICD-10-CM | POA: Insufficient documentation

## 2018-07-15 DIAGNOSIS — N898 Other specified noninflammatory disorders of vagina: Secondary | ICD-10-CM | POA: Diagnosis present

## 2018-07-15 HISTORY — DX: Anxiety disorder, unspecified: F41.9

## 2018-07-15 LAB — POCT PREGNANCY, URINE: PREG TEST UR: NEGATIVE

## 2018-07-15 LAB — URINALYSIS, ROUTINE W REFLEX MICROSCOPIC
BACTERIA UA: NONE SEEN
BILIRUBIN URINE: NEGATIVE
GLUCOSE, UA: NEGATIVE mg/dL
Hgb urine dipstick: NEGATIVE
KETONES UR: NEGATIVE mg/dL
NITRITE: NEGATIVE
PH: 5 (ref 5.0–8.0)
PROTEIN: 30 mg/dL — AB
Specific Gravity, Urine: 1.025 (ref 1.005–1.030)
Squamous Epithelial / LPF: >50 — ABNORMAL HIGH (ref 0–5)
WBC, UA: >50 WBC/hpf — ABNORMAL HIGH (ref 0–5)

## 2018-07-15 LAB — WET PREP, GENITAL
Sperm: NONE SEEN
Trich, Wet Prep: NONE SEEN
Yeast Wet Prep HPF POC: NONE SEEN

## 2018-07-15 MED ORDER — METRONIDAZOLE 500 MG PO TABS: 500.0000 mg | ORAL_TABLET | Freq: Two times a day (BID) | ORAL | 0 refills | Status: DC

## 2018-07-15 NOTE — MAU Note (Signed)
Pt had stated she does not feel like she has a good milk supply.  Pt admitted does not drink much herself.  Discussed the importance of adequate fluid intake to increase milk supply.  Encouraged pt to get something to drink, every time she feeds the baby, "think of it as replacement".  Discussed of water content in fruit, encouraged to increase that also as a way to increase intake.  Very important to stay well hydrated, especially when it is so hot out side, the heat also makes us burn through and require more fluid intake.

## 2018-07-15 NOTE — MAU Note (Signed)
Gave birth 06/03/18.  Was told she would have bleeding, but is having a d/c for a wk now- has an odor.  Has her 6 wk follow up appt tomorrow.  Didn't know if they were going to check that or not.  No pain. No fever.

## 2018-07-15 NOTE — MAU Provider Note (Signed)
History     CSN: [id number]  Arrival date and time: 07/15/18 1638   First Provider Initiated Contact with Patient 07/15/18 1750      Chief Complaint  Patient presents with  . Vaginal Discharge   Lauren Mcconnell is a 27 y.o. [id number] at Unknown who presents today with vaginal discharge. She reports that she had bleeding until about 2 weeks ago. Then it was brown, and then she started to have a large amount of white discharge with a musty odor.   Vaginal Discharge  The patient's primary symptoms include vaginal discharge. The patient's pertinent negatives include no pelvic pain. This is a new problem. The current episode started in the past 7 days. The problem occurs constantly. The problem has been unchanged. The patient is experiencing no pain. Pregnant now: postpartum  Pertinent negatives include no chills, dysuria, fever, frequency, nausea, urgency or vomiting. The vaginal discharge was thick and white. There has been no bleeding. Nothing aggravates the symptoms. She has tried nothing for the symptoms. Sexual activity: denies intercourse since the delivery  She uses nothing (desires depo) for contraception.      Past Medical History:  Diagnosis Date  . Anxiety   . Asthma   . Gonorrhea     Past Surgical History:  Procedure Laterality Date  . DILATION AND CURETTAGE OF UTERUS     x2 for EAB    Family History  Problem Relation Age of Onset  . Hypertension Mother   . Diabetes Maternal Grandmother   . Hypertension Maternal Grandmother   . Alcohol abuse Neg Hx   . Arthritis Neg Hx   . Asthma Neg Hx   . Birth defects Neg Hx   . Cancer Neg Hx   . COPD Neg Hx   . Depression Neg Hx   . Drug abuse Neg Hx   . Early death Neg Hx   . Hearing loss Neg Hx   . Heart disease Neg Hx   . Hyperlipidemia Neg Hx   . Kidney disease Neg Hx   . Learning disabilities Neg Hx   . Mental illness Neg Hx   . Mental retardation Neg Hx   . Miscarriages / Stillbirths Neg Hx   . Stroke Neg Hx    . Vision loss Neg Hx   . Varicose Veins Neg Hx     Social History   Tobacco Use  . Smoking status: Never Smoker  . Smokeless tobacco: Never Used  Substance Use Topics  . Alcohol use: No    Frequency: Never    Comment: none with pregnancy  . Drug use: Not Currently    Types: Marijuana    Comment: a year since last    Allergies:  Allergies  Allergen Reactions  . Aspirin Swelling and Other (See Comments)    Reaction:  Tongue swelling/blurry vision     Medications Prior to Admission  Medication Sig Dispense Refill Last Dose  . albuterol (PROVENTIL HFA;VENTOLIN HFA) 108 (90 Base) MCG/ACT inhaler Inhale 2 puffs into the lungs every 6 (six) hours as needed for wheezing or shortness of breath.   Past Month at Unknown time  . ibuprofen (ADVIL,MOTRIN) 800 MG tablet Take 1 tablet (800 mg total) by mouth every 8 (eight) hours as needed for moderate pain. 30 tablet 0   . Prenatal Vit-Fe Fumarate-FA (PRENATAL MULTIVITAMIN) TABS tablet Take 1 tablet by mouth every evening. 30 tablet 12 06/01/2018 at Unknown time  . terconazole (TERAZOL 3) 0.8 % vaginal cream Place 1 applicator  vaginally at bedtime. (Patient not taking: Reported on 05/23/2018) 20 g 0 Not Taking    Review of Systems  Constitutional: Negative for chills and fever.  Gastrointestinal: Negative for nausea and vomiting.  Genitourinary: Positive for vaginal discharge. Negative for dysuria, frequency, pelvic pain, urgency and vaginal bleeding.   Physical Exam   Blood pressure 106/63, pulse 72, temperature 98.3 F (36.8 C), temperature source Oral, resp. rate 16, weight 140 lb 12 oz (63.8 kg), SpO2 99 %, currently breastfeeding.  Physical Exam  Nursing note and vitals reviewed. Constitutional: She is oriented to person, place, and time. She appears well-developed and well-nourished. No distress.  HENT:  Head: Normocephalic.  Cardiovascular: Normal rate.  Respiratory: Effort normal.  GI: Soft. There is no tenderness. There is  no rebound.  Genitourinary:  Genitourinary Comments: External: no lesion Vagina: small amount of white discharge Cervix: pink, smooth, no CMT Uterus: NSSC Adnexa: NT   Neurological: She is alert and oriented to person, place, and time.  Skin: Skin is warm and dry.  Psychiatric: She has a normal mood and affect.    Results for orders placed or performed during the hospital encounter of 07/15/18 (from the past 24 hour(s))  Urinalysis, Routine w reflex microscopic     Status: Abnormal   Collection Time: 07/15/18  5:01 PM  Result Value Ref Range   Color, Urine YELLOW YELLOW   APPearance CLOUDY (A) CLEAR   Specific Gravity, Urine 1.025 1.005 - 1.030   pH 5.0 5.0 - 8.0   Glucose, UA NEGATIVE NEGATIVE mg/dL   Hgb urine dipstick NEGATIVE NEGATIVE   Bilirubin Urine NEGATIVE NEGATIVE   Ketones, ur NEGATIVE NEGATIVE mg/dL   Protein, ur 30 (A) NEGATIVE mg/dL   Nitrite NEGATIVE NEGATIVE   Leukocytes, UA LARGE (A) NEGATIVE   RBC / HPF 21-50 0 - 5 RBC/hpf   WBC, UA >50 (H) 0 - 5 WBC/hpf   Bacteria, UA NONE SEEN NONE SEEN   Squamous Epithelial / LPF >50 (H) 0 - 5   WBC Clumps PRESENT    Mucus PRESENT   Pregnancy, urine POC     Status: None   Collection Time: 07/15/18  5:20 PM  Result Value Ref Range   Preg Test, Ur NEGATIVE NEGATIVE  Wet prep, genital     Status: Abnormal   Collection Time: 07/15/18  5:59 PM  Result Value Ref Range   Yeast Wet Prep HPF POC NONE SEEN NONE SEEN   Trich, Wet Prep NONE SEEN NONE SEEN   Clue Cells Wet Prep HPF POC PRESENT (A) NONE SEEN   WBC, Wet Prep HPF POC MODERATE (A) NONE SEEN   Sperm NONE SEEN      MAU Course  Procedures  MDM   Assessment and Plan   1. Bacterial vaginosis    DC home Comfort measures reviewed  RX: flagyl 500mg  BID x 7 days sent to pharmacy  Return to MAU as needed   Follow-up Information    [facility]   Specialty:  Obstetrics and Gynecology Contact information: [address] [place] [id number] [phone]           Thressa ShellerHeather Hogan 07/15/2018, 5:59 PM

## 2018-07-15 NOTE — Discharge Instructions (Signed)

## 2018-07-16 ENCOUNTER — Encounter: Payer: Self-pay | Admitting: Advanced Practice Midwife

## 2018-07-16 ENCOUNTER — Ambulatory Visit (INDEPENDENT_AMBULATORY_CARE_PROVIDER_SITE_OTHER): Payer: Medicaid Other | Admitting: Advanced Practice Midwife

## 2018-07-16 DIAGNOSIS — Z1389 Encounter for screening for other disorder: Secondary | ICD-10-CM | POA: Diagnosis not present

## 2018-07-16 LAB — GC/CHLAMYDIA PROBE AMP (~~LOC~~) NOT AT ARMC
Chlamydia: NEGATIVE
Neisseria Gonorrhea: NEGATIVE

## 2018-07-16 NOTE — Progress Notes (Signed)
Subjective:     Lauren Mcconnell is a 27 y.o. female who presents for a postpartum visit. She is 6 weeks postpartum following a spontaneous vaginal delivery. I have fully reviewed the prenatal and intrapartum course. The delivery was at 38/2 gestational weeks. Outcome: spontaneous vaginal delivery. Anesthesia: none. Postpartum course has been uncomplicated. Baby's course has been uncomplicated. Baby is feeding by breast. Bleeding no bleeding. Bowel function is normal. Bladder function is normal. Patient is not sexually active. Contraception method is none. Postpartum depression screening: negative, but patient reports feeling "numb" on occasion. She is interested in seeing Asher MuirJamie about this.    The following portions of the patient's history were reviewed and updated as appropriate: allergies, current medications, past family history, past medical history, past social history, past surgical history and problem list.  Review of Systems Pertinent items are noted in HPI.   Objective:    BP 120/60   Pulse 73   Ht 5\' 2"  (1.575 m)   Wt 144 lb 4.8 oz (65.5 kg)   Breastfeeding? Yes   BMI 26.39 kg/m   General:  alert and cooperative   Breasts:  inspection negative, no nipple discharge or bleeding, no masses or nodularity palpable  Lungs: clear to auscultation bilaterally  Heart:  regular rate and rhythm, S1, S2 normal, no murmur, click, rub or gallop  Abdomen: soft, non-tender; bowel sounds normal; no masses,  no organomegaly   Vulva:  examined by myself yesterday in MAU, and normal   Vagina: examined by myself yesterday in MAU and normal   Cervix:  multiparous appearance and examined by myself yesterday in MAU and normal   Corpus: examined by myself yesterday in MAU and normal   Adnexa:  examined by myself yesterday in MAU and normal   Rectal Exam: Not performed.        Assessment:     routine postpartum exam. Pap smear not done at today's visit.   Plan:    1. Contraception: considering  depo, but does not wish to start today.  2. Some PP mood changes, will see Jamie  3. Follow up in: 1 year or as needed.

## 2018-07-16 NOTE — Patient Instructions (Signed)
Hormonal Contraception Information °Hormonal contraception is a type of birth control that uses hormones to prevent pregnancy. It usually involves a combination of the hormones estrogen and progesterone or only the hormone progesterone. Hormonal contraception works in these ways: °· It thickens the mucus in the cervix, making it harder for sperm to enter the uterus. °· It changes the lining of the uterus, making it harder for an egg to implant. °· It may stop the ovaries from releasing eggs (ovulation). Some women who take hormonal contraceptives that contain only progesterone may continue to ovulate. ° °Hormonal contraception cannot prevent sexually transmitted infections (STIs). Pregnancy may still occur. °Estrogen and progesterone contraceptives °Contraceptives that use a combination of estrogen and progesterone are available in these forms: °· Pill. Pills come in different combinations of hormones. They must be taken at the same time each day. Pills can affect your period, causing you to get your period once every three months or not at all. °· Patch. The patch must be worn on the lower abdomen for three weeks and then removed on the fourth. °· Vaginal ring. The ring is placed in the vagina and left there for three weeks. It is then removed for one week. ° °Progesterone contraceptives °Contraceptives that use progesterone only are available in these forms: °· Pill. Pills should be taken every day of the cycle. °· Intrauterine device (IUD). This device is inserted into the uterus and removed or replaced every five years or sooner. °· Implant. Plastic rods are placed under the skin of the upper arm. They are removed or replaced every three years or sooner. °· Injection. The injection is given once every 90 days. ° °What are the side effects? °The side effects of estrogen and progesterone contraceptives include: °· Nausea. °· Headaches. °· Breast tenderness. °· Bleeding or spotting between menstrual cycles. °· High  blood pressure (rare). °· Strokes, heart attacks, or blood clots (rare) ° °Side effects of progesterone-only contraceptives include: °· Nausea. °· Headaches. °· Breast tenderness. °· Unpredictable menstrual bleeding. °· High blood pressure (rare). ° °Talk to your health care provider about what side effects may affect you. °Where to find more information: °· Ask your health care provider for more information and resources about hormonal contraception. °· [place] Department of Health and Human Services Office on Women's Health: www.womenshealth.gov °Questions to ask: °· What type of hormonal contraception is right for me? °· How long should I plan to use hormonal contraception? °· What are the side effects of the hormonal contraception method I choose? °· How can I prevent STIs while using hormonal contraception? °Contact a health care provider if: °· You start taking hormonal contraceptives and you develop persistent or severe side effects. °Summary °· Estrogen and progesterone are hormones used in many forms of birth control. °· Talk to your health care provider about what side effects may affect you. °· Hormonal contraception cannot prevent sexually transmitted infections (STIs). °· Ask your health care provider for more information and resources about hormonal contraception. °This information is not intended to replace advice given to you by your health care provider. Make sure you discuss any questions you have with your health care provider. °Document Released: 01/01/2008 Document Revised: 11/11/2016 Document Reviewed: 11/11/2016 °Elsevier Interactive Patient Education © 2018 Elsevier Inc. ° °

## 2018-07-16 NOTE — Progress Notes (Signed)
Pt interested in the Depo Shot but would like tyo discuss other options.

## 2018-11-24 ENCOUNTER — Emergency Department (HOSPITAL_COMMUNITY)
Admission: EM | Admit: 2018-11-24 | Discharge: 2018-11-24 | Disposition: A | Discharge status: Home / Self Care | Payer: Medicaid Other | Attending: Emergency Medicine | Admitting: Emergency Medicine

## 2018-11-24 ENCOUNTER — Encounter (HOSPITAL_COMMUNITY): Payer: Self-pay | Admitting: Emergency Medicine

## 2018-11-24 ENCOUNTER — Other Ambulatory Visit: Payer: Self-pay

## 2018-11-24 DIAGNOSIS — H109 Unspecified conjunctivitis: Secondary | ICD-10-CM

## 2018-11-24 DIAGNOSIS — J029 Acute pharyngitis, unspecified: Secondary | ICD-10-CM | POA: Insufficient documentation

## 2018-11-24 DIAGNOSIS — J45909 Unspecified asthma, uncomplicated: Secondary | ICD-10-CM | POA: Insufficient documentation

## 2018-11-24 DIAGNOSIS — R0981 Nasal congestion: Secondary | ICD-10-CM | POA: Insufficient documentation

## 2018-11-24 MED ORDER — FLUTICASONE PROPIONATE 50 MCG/ACT NA SUSP: 1.0000 | Freq: Every day | NASAL | 0 refills | Status: DC

## 2018-11-24 MED ORDER — TETRACAINE HCL 0.5 % OP SOLN
2.0000 [drp] | Freq: Once | OPHTHALMIC | Status: AC
Start: 2018-11-24 — End: 2018-11-24
  Administered 2018-11-24: 2 [drp] via OPHTHALMIC
  Filled 2018-11-24: qty 4

## 2018-11-24 MED ORDER — FLUORESCEIN SODIUM 1 MG OP STRP
1.0000 | ORAL_STRIP | Freq: Once | OPHTHALMIC | Status: AC
  Administered 2018-11-24: 1 via OPHTHALMIC
  Filled 2018-11-24: qty 1

## 2018-11-24 MED ORDER — ERYTHROMYCIN 5 MG/GM OP OINT: TOPICAL_OINTMENT | OPHTHALMIC | 0 refills | Status: DC

## 2018-11-24 NOTE — ED Triage Notes (Signed)
Rt eye irritation since this am having crusty drainage  And irritation and congestion

## 2018-11-24 NOTE — Discharge Instructions (Addendum)
You were seen in the Er today for eye irritation. We suspect you have conjunctivitis, otherwise known as pink eye. We are sending you home with eye antibiotic ointment- please use this as prescribed. We are also sending you home with flonase to help with congestion, please take this as prescribed as well.   Follow up with the ophthalmologist in your discharge instructions in 3-4 days for reeevalution. Return to the ER for new or worsening symptoms such as change in vision, increased pain, swelling around the eye, redness around the eye, trouble moving your eye, fever, or any other concerns. .Marland Kitchen

## 2018-11-24 NOTE — ED Provider Notes (Addendum)
[facility]   CSN: [id number] Arrival date & time: 11/24/18  1308     History   Chief Complaint Chief Complaint  Patient presents with  . Eye Problem    HPI Lauren Mcconnell is a 27 y.o. female with a hx of anxiety and asthma who presents to the ED with R eye irritation that started this AM when she woke up. Woke up with R eye erythema, irritation, and drainage/crusting. Sxs are constant. No specific alleviating/aggravating factors. No intervention PTA. She has also had congestion, and mild sore throat. Denies fever, chills, ear pain, change in vision, photophobia, cough, or dyspnea. Patient is not a contact lens wearer and she does not utilize glasses.   HPI  Past Medical History:  Diagnosis Date  . Anxiety   . Asthma   . Gonorrhea     Patient Active Problem List   Diagnosis Date Noted  . SVD (spontaneous vaginal delivery) 06/02/2018  . Group B streptococcal infection during pregnancy 05/22/2018  . Limited prenatal care 04/29/2018  . Supervision of other normal pregnancy, antepartum 12/28/2017    Past Surgical History:  Procedure Laterality Date  . DILATION AND CURETTAGE OF UTERUS     x2 for EAB     OB History    Gravida  9   Para  1   Term  1   Preterm  0   AB  8   Living  1     SAB  3   TAB  5   Ectopic  0   Multiple  0   Live Births  1        Obstetric Comments  G1: 2010 EAB via D&C 3048m G2: 2012 EAB 8wks pills G3: unsure date 14wks EAB via D&C G4: unsure date 9wks EAB pills G5: 2018 missed AB at 8wks         Home Medications    Prior to Admission medications   Medication Sig Start Date End Date Taking? Authorizing Provider  albuterol (PROVENTIL HFA;VENTOLIN HFA) 108 (90 Base) MCG/ACT inhaler Inhale 2 puffs into the lungs every 6 (six) hours as needed for wheezing or shortness of breath.    [provider]  ibuprofen (ADVIL,MOTRIN) 800 MG tablet Take 1 tablet (800 mg  total) by mouth every 8 (eight) hours as needed for moderate pain. 06/05/18   Hipkins, Verdene LennertBrittany P, MD  metroNIDAZOLE (FLAGYL) 500 MG tablet Take 1 tablet (500 mg total) by mouth 2 (two) times daily. Patient not taking: Reported on 07/16/2018 07/15/18   Thressa ShellerHogan, Heather D, CNM  Prenatal Vit-Fe Fumarate-FA (PRENATAL MULTIVITAMIN) TABS tablet Take 1 tablet by mouth every evening. Patient not taking: Reported on 07/16/2018 10/22/17   Katrinka BlazingSmith, [place], CNM  terconazole (TERAZOL 3) 0.8 % vaginal cream Place 1 applicator vaginally at bedtime. Patient not taking: Reported on 05/23/2018 05/17/18   Marny LowensteinWenzel, Julie N, PA-C    Family History Family History  Problem Relation Age of Onset  . Hypertension Mother   . Diabetes Maternal Grandmother   . Hypertension Maternal Grandmother   . Alcohol abuse Neg Hx   . Arthritis Neg Hx   . Asthma Neg Hx   . Birth defects Neg Hx   . Cancer Neg Hx   . COPD Neg Hx   . Depression Neg Hx   . Drug abuse Neg Hx   . Early death Neg Hx   . Hearing loss Neg Hx   . Heart disease Neg Hx   .  Hyperlipidemia Neg Hx   . Kidney disease Neg Hx   . Learning disabilities Neg Hx   . Mental illness Neg Hx   . Mental retardation Neg Hx   . Miscarriages / Stillbirths Neg Hx   . Stroke Neg Hx   . Vision loss Neg Hx   . Varicose Veins Neg Hx     Social History Social History   Tobacco Use  . Smoking status: Never Smoker  . Smokeless tobacco: Never Used  Substance Use Topics  . Alcohol use: No    Frequency: Never    Comment: none with pregnancy  . Drug use: Not Currently    Types: Marijuana    Comment: a year since last     Allergies   Aspirin   Review of Systems Review of Systems  Constitutional: Negative for chills and fever.  HENT: Positive for congestion and sore throat. Negative for ear pain, trouble swallowing and voice change.   Eyes: Positive for pain ("irritation"), discharge and redness. Negative for photophobia and visual disturbance.  Respiratory:  Negative for shortness of breath.   Cardiovascular: Negative for chest pain.     Physical Exam Updated Vital Signs BP 109/74 (BP Location: Right Arm)   Pulse 82   Temp 98.7 F (37.1 C) (Oral)   Resp 16   SpO2 98%   Physical Exam  Constitutional: She appears well-developed and well-nourished.  Non-toxic appearance. No distress.  HENT:  Head: Normocephalic and atraumatic.  Right Ear: Tympanic membrane normal. Tympanic membrane is not perforated, not erythematous, not retracted and not bulging.  Left Ear: Tympanic membrane normal. Tympanic membrane is not perforated, not erythematous, not retracted and not bulging.  Nose: Mucosal edema present. Right sinus exhibits no maxillary sinus tenderness and no frontal sinus tenderness. Left sinus exhibits no maxillary sinus tenderness and no frontal sinus tenderness.  Mouth/Throat: Uvula is midline. Posterior oropharyngeal erythema (minimal) present. No oropharyngeal exudate.  Posterior oropharynx is symmetric appearing. Patient tolerating own secretions without difficulty. No trismus. No drooling. No hot potato voice. No swelling beneath the tongue, submandibular compartment is soft.   Eyes: Pupils are equal, round, and reactive to light. EOM and lids are normal. Lids are everted and swept, no foreign bodies found. Right eye exhibits no discharge, no exudate and no hordeolum. No foreign body present in the right eye. Left eye exhibits no discharge, no exudate and no hordeolum. No foreign body present in the left eye. Right conjunctiva is injected (mild). Right conjunctiva has no hemorrhage. Left conjunctiva is not injected. Left conjunctiva has no hemorrhage.  No proptosis. No periorbital erythema/warmth/swelling/drainage.  No consensual photophobia.  Woods lamp & slit lamp exam w/ fluorescein stain: No uptake. No evidence of ulceration, abrasion, or dendritic Negative seidel. No steam cornea noted. No hyphema or hypopyon.   Visual Acuity Bilateral  Distance:20/25 R Distance:20/40 L Distance:20/32  Cardiovascular: Normal rate and regular rhythm.  Pulmonary/Chest: Effort normal and breath sounds normal. No respiratory distress.  Neurological: She is alert.  Clear speech.   Psychiatric: She has a normal mood and affect. Her behavior is normal. Thought content normal.  Nursing note and vitals reviewed.    ED Treatments / Results  Labs (all labs ordered are listed, but only abnormal results are displayed) Labs Reviewed - No data to display  EKG None  Radiology No results found.  Procedures Procedures (including critical care time)  Medications Ordered in ED Medications  fluorescein ophthalmic strip 1 strip (has no administration in time range)  tetracaine (PONTOCAINE) 0.5 % ophthalmic solution 2 drop (has no administration in time range)     Initial Impression / Assessment and Plan / ED Course  I have reviewed the triage vital signs and the nursing notes.  Pertinent labs & imaging results that were available during my care of the patient were reviewed by me and considered in my medical decision making (see chart for details).   Patient presents to the ED with R eye irritation/erythema/drainage. There is no fluorescein updake on exam, no indication of corneal abrasion/ulceration or HSV. Patient is afebrile and without proptosis, entrapment, or consensual photophobia, no periorbital swelling- doubt periorbital or orbital cellulitis. Low suspicion for acute angle closure glaucoma based on history and exam. No significant visual acuity deficit, mild deficit in R eye, but patient reports no visual changes from her baseline. Given associated URI sxs, likely more viral but will cover for potential bacterial conjunctivitis with erythyromycin ointment, patient is not a contact lens wearer, flonase for congestion. Centor 0, doubt strep, afebrile without sinus tenderness, doubt acute bacterial sinusitis.  I discussed treatment plan, need  for follow-up w/ PCP and/or opthalmology, and return precautions with the patient. Provided opportunity for questions, patient confirmed understanding and is in agreement with plan.    Final Clinical Impressions(s) / ED Diagnoses   Final diagnoses:  Conjunctivitis of right eye, unspecified conjunctivitis type  Nasal congestion    ED Discharge Orders         Ordered    erythromycin ophthalmic ointment     11/24/18 1358    fluticasone (FLONASE) 50 MCG/ACT nasal spray  Daily     11/24/18 1358           Cherly Anderson, PA-C 11/24/18 1359    Cherly Anderson, PA-C 11/24/18 1533    Mesner, Barbara Cower, MD 11/24/18 [phone]

## 2019-10-11 ENCOUNTER — Other Ambulatory Visit: Payer: Self-pay

## 2019-10-11 DIAGNOSIS — Z20822 Contact with and (suspected) exposure to covid-19: Secondary | ICD-10-CM

## 2019-10-11 DIAGNOSIS — Z20828 Contact with and (suspected) exposure to other viral communicable diseases: Secondary | ICD-10-CM | POA: Diagnosis not present

## 2019-10-13 LAB — NOVEL CORONAVIRUS, NAA: SARS-CoV-2, NAA: NOT DETECTED

## 2019-11-16 ENCOUNTER — Other Ambulatory Visit: Payer: Self-pay

## 2019-11-16 ENCOUNTER — Emergency Department (HOSPITAL_COMMUNITY)
Admission: EM | Admit: 2019-11-16 | Discharge: 2019-11-16 | Disposition: A | Discharge status: Home / Self Care | Payer: Self-pay | Attending: Emergency Medicine | Admitting: Emergency Medicine

## 2019-11-16 DIAGNOSIS — H1032 Unspecified acute conjunctivitis, left eye: Secondary | ICD-10-CM | POA: Insufficient documentation

## 2019-11-16 DIAGNOSIS — J45909 Unspecified asthma, uncomplicated: Secondary | ICD-10-CM | POA: Insufficient documentation

## 2019-11-16 MED ORDER — ERYTHROMYCIN 5 MG/GM OP OINT: TOPICAL_OINTMENT | OPHTHALMIC | 0 refills | Status: DC

## 2019-11-16 MED ORDER — FLUORESCEIN SODIUM 1 MG OP STRP
1.0000 | ORAL_STRIP | Freq: Once | OPHTHALMIC | Status: AC
  Administered 2019-11-16: 15:00:00 1 via OPHTHALMIC
  Filled 2019-11-16: qty 1

## 2019-11-16 MED ORDER — TETRACAINE HCL 0.5 % OP SOLN
2.0000 [drp] | Freq: Once | OPHTHALMIC | Status: AC
  Administered 2019-11-16: 2 [drp] via OPHTHALMIC
  Filled 2019-11-16: qty 4

## 2019-11-16 NOTE — ED Provider Notes (Signed)
[facility] Provider [facility]   CSN: [id number] Arrival date & time: 11/16/19  1238    History   Chief Complaint Chief Complaint  Patient presents with  . Eye Pain    HPI Lauren Mcconnell is a 28 y.o. female with past medical history significant for anxiety who presents for evaluation of left eye pain. 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  She states she feels like skin below her eye is irritated when she touches this.  She has noticed some green drainage to her eye and eye pruritus.  She has been using allergy drops which relieve the itching for approximately 6 hours each time.  Denies fever, chills, facial swelling, redness, warmth, pain with EOMs, photophobia, blurred vision, double vision.  Patient states she does not work around a Engineer, [employer].  She denies wearing contacts or glasses.  Has additional aggravating or alleviating factors.  History obtained from patient and past medical records.  No interpreter is used.     HPI  Past Medical History:  Diagnosis Date  . Anxiety   . Asthma   . Gonorrhea     Patient Active Problem List   Diagnosis Date Noted  . SVD (spontaneous vaginal delivery) 06/02/2018  . Group B streptococcal infection during pregnancy 05/22/2018  . Limited prenatal care 04/29/2018  . Supervision of other normal pregnancy, antepartum 12/28/2017    Past Surgical History:  Procedure Laterality Date  . DILATION AND CURETTAGE OF UTERUS     x2 for EAB     OB History    Gravida  9   Para  1   Term  1   Preterm  0   AB  8   Living  1     SAB  3   TAB  5   Ectopic  0   Multiple  0   Live Births  1        Obstetric Comments  G1: 2010 EAB via D&C 5347m G2: 2012 EAB 8wks pills G3: unsure date 14wks EAB via D&C G4: unsure date 9wks EAB pills G5: 2018 missed AB at 8wks         Home Medications    Prior to Admission medications    Medication Sig Start Date End Date Taking? Authorizing Provider  albuterol (PROVENTIL HFA;VENTOLIN HFA) 108 (90 Base) MCG/ACT inhaler Inhale 2 puffs into the lungs every 6 (six) hours as needed for wheezing or shortness of breath.    [provider]  erythromycin ophthalmic ointment Place a 1/2 inch ribbon of ointment into the lower eyelid. 11/16/19   Naviah Belfield A, PA-C  fluticasone (FLONASE) 50 MCG/ACT nasal spray Place 1 spray into both nostrils daily. 11/24/18   Petrucelli, Samantha R, PA-C  ibuprofen (ADVIL,MOTRIN) 800 MG tablet Take 1 tablet (800 mg total) by mouth every 8 (eight) hours as needed for moderate pain. 06/05/18   Hipkins, Verdene LennertBrittany P, MD    Family History Family History  Problem Relation Age of Onset  . Hypertension Mother   . Diabetes Maternal Grandmother   . Hypertension Maternal Grandmother   . Alcohol abuse Neg Hx   . Arthritis Neg Hx   . Asthma Neg Hx   . Birth defects Neg Hx   . Cancer Neg Hx   . COPD Neg Hx   . Depression Neg Hx   . Drug abuse Neg Hx   . Early death  Neg Hx   . Hearing loss Neg Hx   . Heart disease Neg Hx   . Hyperlipidemia Neg Hx   . Kidney disease Neg Hx   . Learning disabilities Neg Hx   . Mental illness Neg Hx   . Mental retardation Neg Hx   . Miscarriages / Stillbirths Neg Hx   . Stroke Neg Hx   . Vision loss Neg Hx   . Varicose Veins Neg Hx     Social History Social History   Tobacco Use  . Smoking status: Never Smoker  . Smokeless tobacco: Never Used  Substance Use Topics  . Alcohol use: No    Frequency: Never    Comment: none with pregnancy  . Drug use: Not Currently    Types: Marijuana    Comment: a year since last     Allergies   Aspirin   Review of Systems Review of Systems  Constitutional: Negative.   HENT: Negative.   Eyes: Positive for pain, discharge and itching. Negative for photophobia, redness and visual disturbance.  Respiratory: Negative.   Cardiovascular: Negative.    Gastrointestinal: Negative.   Genitourinary: Negative.   Musculoskeletal: Negative.   Skin: Negative.   Neurological: Negative.   All other systems reviewed and are negative.  Physical Exam Updated Vital Signs BP 107/72 (BP Location: Right Arm)   Pulse 73   Temp 98.2 F (36.8 C) (Oral)   Resp 16   SpO2 100%   Physical Exam Vitals signs and nursing note reviewed.  Constitutional:      General: She is not in acute distress.    Appearance: She is well-developed. She is not ill-appearing, toxic-appearing or diaphoretic.  HENT:     Head: Normocephalic and atraumatic.     Nose: Nose normal.     Mouth/Throat:     Mouth: Mucous membranes are moist.     Pharynx: Oropharynx is clear.  Eyes:     General: Lids are normal. Lids are everted, no foreign bodies appreciated. Vision grossly intact.        Right eye: No foreign body, discharge or hordeolum.        Left eye: No foreign body, discharge or hordeolum.     Intraocular pressure: Right eye pressure is 16 mmHg. Left eye pressure is 17 mmHg.     Extraocular Movements: Extraocular movements intact.     Conjunctiva/sclera:     Right eye: Right conjunctiva is not injected. No chemosis, exudate or hemorrhage.    Left eye: Left conjunctiva is injected. No chemosis, exudate or hemorrhage.    Pupils: Pupils are equal, round, and reactive to light.  Neck:     Musculoskeletal: Full passive range of motion without pain and normal range of motion.     Trachea: Phonation normal.  Cardiovascular:     Rate and Rhythm: Normal rate.     Pulses: Normal pulses.     Heart sounds: Normal heart sounds.  Pulmonary:     Effort: Pulmonary effort is normal. No respiratory distress.     Breath sounds: Normal breath sounds and air entry.  Abdominal:     General: Bowel sounds are normal. There is no distension.  Musculoskeletal: Normal range of motion.  Skin:    General: Skin is warm and dry.  Neurological:     Mental Status: She is alert.    ED  Treatments / Results  Labs (all labs ordered are listed, but only abnormal results are displayed) Labs Reviewed - No data to  display  EKG None  Radiology No results found.  Procedures Procedures (including critical care time)  Medications Ordered in ED Medications  tetracaine (PONTOCAINE) 0.5 % ophthalmic solution 2 drop (has no administration in time range)  fluorescein ophthalmic strip 1 strip (has no administration in time range)   Initial Impression / Assessment and Plan / ED Course  I have reviewed the triage vital signs and the nursing notes.  Pertinent labs & imaging results that were available during my care of the patient were reviewed by me and considered in my medical decision making (see chart for details).  28 year old presents for evaluation of eye discharge. Afebrile, non septic, non ill appearing. Fullness of mobian glands on exam. EOM intact without pain. No evidence of pre- septal orbital or orbital cellulitis. Visual acuity intact without changes. Patient presentation consistent with conjunctivitis.  No corneal abrasions, entrapment, consensual photophobia, or dendritic staining with fluorescein study. Presentation non-concerning for iritis, corneal abrasions, retained foreign body, acute angle glaucoma, or HSV. Personal hygiene and frequent handwashing discussed.  Patient advised to followup with ophthalmologist if symptoms persist or worsen in any way including vision change or purulent discharge.  Patient verbalizes understanding and is agreeable with discharge.  The patient has been appropriately medically screened and/or stabilized in the ED. I have low suspicion for any other emergent medical condition which would require further screening, evaluation or treatment in the ED or require inpatient management.      Final Clinical Impressions(s) / ED Diagnoses   Final diagnoses:  Acute conjunctivitis of left eye, unspecified acute conjunctivitis type    ED  Discharge Orders         Ordered    erythromycin ophthalmic ointment     11/16/19 1436           Zabella Wease A, PA-C 11/16/19 1440    Alvira Monday, MD 11/17/19 [phone]

## 2019-11-16 NOTE — Discharge Instructions (Signed)
Use the antibiotics as prescribed. Follow up with Opthalmology on Monday or return to the ED for new or worsening symptoms.

## 2019-11-16 NOTE — ED Triage Notes (Signed)
Pt thinks she has pink eye. Pain in left eye. 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 Pt states she woke up with "goopy" drainage in the corners of her eye this morning.

## 2019-11-16 NOTE — ED Notes (Signed)
Patient verbalizes understanding of discharge instructions. Opportunity for questioning and answers were provided. Armband removed by staff, pt discharged from ED ambulatory.   

## 2020-06-14 ENCOUNTER — Encounter (HOSPITAL_COMMUNITY): Payer: Self-pay | Admitting: Emergency Medicine

## 2020-06-14 ENCOUNTER — Emergency Department (HOSPITAL_COMMUNITY)
Admission: EM | Admit: 2020-06-14 | Discharge: 2020-06-14 | Disposition: A | Discharge status: Home / Self Care | Payer: Medicaid Other | Attending: Emergency Medicine | Admitting: Emergency Medicine

## 2020-06-14 ENCOUNTER — Other Ambulatory Visit: Payer: Self-pay

## 2020-06-14 DIAGNOSIS — H5789 Other specified disorders of eye and adnexa: Secondary | ICD-10-CM | POA: Diagnosis not present

## 2020-06-14 MED ORDER — FLUORESCEIN SODIUM 1 MG OP STRP
1.0000 | ORAL_STRIP | Freq: Once | OPHTHALMIC | Status: DC
  Filled 2020-06-14: qty 1

## 2020-06-14 MED ORDER — TETRACAINE HCL 0.5 % OP SOLN
2.0000 [drp] | Freq: Once | OPHTHALMIC | Status: DC
  Filled 2020-06-14: qty 4

## 2020-06-14 MED ORDER — ERYTHROMYCIN 5 MG/GM OP OINT: TOPICAL_OINTMENT | OPHTHALMIC | 1 refills | Status: AC

## 2020-06-14 NOTE — ED Triage Notes (Signed)
C/o R eye redness and itching since yesterday.  States she thinks she got something in it at work yesterday.

## 2020-06-14 NOTE — ED Provider Notes (Signed)
[facility] EMERGENCY DEPARTMENT Provider Note   CSN: [id number] Arrival date & time: 06/14/20  1249     History Chief Complaint  Patient presents with  . eye redness    Lauren Mcconnell is a 29 y.o. female.  HPI     Lauren Mcconnell is a 29 y.o. female, with a history of anxiety, asthma, presenting to the ED with right eye irritation beginning yesterday.  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 The irritation is mild and only when she looks to the right.  No other irritation or pain with eye movements.  Denies contact lens use.  Denies vision abnormalities, facial pain/swelling, fever, headache, recent illness, recent trauma, photophobia, or any other complaints.     Past Medical History:  Diagnosis Date  . Anxiety   . Asthma   . Gonorrhea     Patient Active Problem List   Diagnosis Date Noted  . SVD (spontaneous vaginal delivery) 06/02/2018  . Group B streptococcal infection during pregnancy 05/22/2018  . Limited prenatal care 04/29/2018  . Supervision of other normal pregnancy, antepartum 12/28/2017    Past Surgical History:  Procedure Laterality Date  . DILATION AND CURETTAGE OF UTERUS     x2 for EAB     OB History    Gravida  9   Para  1   Term  1   Preterm  0   AB  8   Living  1     SAB  3   TAB  5   Ectopic  0   Multiple  0   Live Births  1        Obstetric Comments  G1: 2010 EAB via D&C 48m G2: 2012 EAB 8wks pills G3: unsure date 14wks EAB via D&C G4: unsure date 9wks EAB pills G5: 2018 missed AB at 8wks        Family History  Problem Relation Age of Onset  . Hypertension Mother   . Diabetes Maternal Grandmother   . Hypertension Maternal Grandmother   . Alcohol abuse Neg Hx   . Arthritis Neg Hx   . Asthma Neg Hx   . Birth defects Neg Hx   . Cancer Neg Hx   . COPD Neg Hx   . Depression Neg Hx   . Drug abuse Neg Hx   . Early death Neg Hx   . Hearing  loss Neg Hx   . Heart disease Neg Hx   . Hyperlipidemia Neg Hx   . Kidney disease Neg Hx   . Learning disabilities Neg Hx   . Mental illness Neg Hx   . Mental retardation Neg Hx   . Miscarriages / Stillbirths Neg Hx   . Stroke Neg Hx   . Vision loss Neg Hx   . Varicose Veins Neg Hx     Social History   Tobacco Use  . Smoking status: Never Smoker  . Smokeless tobacco: Never Used  Substance Use Topics  . Alcohol use: No    Comment: none with pregnancy  . Drug use: Not Currently    Types: Marijuana    Comment: a year since last    Home Medications Prior to Admission medications   Medication Sig Start Date End Date Taking? Authorizing Provider  ibuprofen (ADVIL) 200 MG tablet Take 200 mg by mouth every 6 (six) hours as needed for headache (pain).   Yes [provider]  Prenatal Vit-Fe  Fumarate-FA (PRENATAL MULTIVITAMIN) TABS tablet Take 1 tablet by mouth at bedtime.   Yes [provider]  erythromycin ophthalmic ointment Place a 1/2 inch ribbon of ointment into the lower eyelid. Apply 4 times a day for 5 days. 06/14/20   Hatsumi Steinhart C, PA-C    Allergies    Aspirin  Review of Systems   Review of Systems  Constitutional: Negative for fever.  HENT: Negative for congestion, dental problem, ear discharge, ear pain, rhinorrhea, sinus pressure, sinus pain and sore throat.   Eyes: Positive for redness. Negative for photophobia and visual disturbance.  Gastrointestinal: Negative for nausea and vomiting.    Physical Exam Updated Vital Signs BP 102/62 (BP Location: Left Arm)   Pulse 66   Temp 98.1 F (36.7 C) (Oral)   Resp 16   Ht 5\' 2"  (1.575 m)   Wt 59 kg   LMP 06/07/2020   SpO2 98%   BMI 23.78 kg/m   Physical Exam Vitals and nursing note reviewed.  Constitutional:      General: She is not in acute distress.    Appearance: She is well-developed. She is not diaphoretic.  HENT:     Head: Normocephalic and atraumatic.  Eyes:     Extraocular  Movements: Extraocular movements intact.     Pupils: Pupils are equal, round, and reactive to light.     Comments: She had a very small area of erythema to the medial sclera of the right eye.  No hyphema or proptosis noted.  No pain with EOMs on my exam. Eyelids without noted swelling, erythema, tenderness, or ptosis. No noted crusting or active drainage.  Cardiovascular:     Rate and Rhythm: Normal rate and regular rhythm.  Pulmonary:     Effort: Pulmonary effort is normal.  Musculoskeletal:     Cervical back: Neck supple.  Skin:    General: Skin is warm and dry.     Coloration: Skin is not pale.  Neurological:     Mental Status: She is alert.  Psychiatric:        Behavior: Behavior normal.     ED Results / Procedures / Treatments   Labs (all labs ordered are listed, but only abnormal results are displayed) Labs Reviewed - No data to display  EKG None  Radiology No results found.  Procedures Procedures (including critical care time)  Medications Ordered in ED Medications  tetracaine (PONTOCAINE) 0.5 % ophthalmic solution 2 drop ( Right Eye Canceled Entry 06/14/20 1445)  fluorescein ophthalmic strip 1 strip ( Right Eye Canceled Entry 06/14/20 1445)    ED Course  I have reviewed the triage vital signs and the nursing notes.  Pertinent labs & imaging results that were available during my care of the patient were reviewed by me and considered in my medical decision making (see chart for details).    MDM Rules/Calculators/A&P                          Patient presents with irritation to the right eye.  She denies any vision changes, trauma, etc. I recommended further evaluation with visual acuity testing, fluorescein staining, and slit lamp exam, however, when I was about to perform these tasks, patient stated that she needed to leave due to concerns over childcare. I gave the patient options for continuing her evaluation with ophthalmology follow-up or return to the  ED. As I did not want the patient to have no treatment in case she  is lost to follow-up, I wrote her prescription for erythromycin ophthalmic ointment.    Final Clinical Impression(s) / ED Diagnoses Final diagnoses:  Irritation of right eye    Rx / DC Orders ED Discharge Orders         Ordered    erythromycin ophthalmic ointment     Discontinue  Reprint     06/14/20 [place], Gearhart, PA-C 06/14/20 1846    Little, Wenda Overland, MD 06/17/20 [phone]

## 2020-06-14 NOTE — Discharge Instructions (Addendum)
Rinse the eye out with clean, luke warm water.  Apply the erythromycin ointment to the lower lid four times a day for 5 days.  Should symptoms fail to improve, follow up with ophthalmology (eye doctor). Call to make an appointment.   Return to the ED, as needed.

## 2020-06-14 NOTE — ED Notes (Signed)
Pt verbalizes understanding of d/c instructions. Prescriptions reviewed with patient. Pt ambulatory at d/c with all belongings.  

## 2020-09-09 ENCOUNTER — Ambulatory Visit (HOSPITAL_COMMUNITY)
Admission: EM | Admit: 2020-09-09 | Discharge: 2020-09-09 | Disposition: A | Discharge status: Home / Self Care | Payer: Medicaid Other | Attending: Family Medicine | Admitting: Family Medicine

## 2020-09-09 ENCOUNTER — Other Ambulatory Visit: Payer: Self-pay

## 2020-09-09 ENCOUNTER — Encounter (HOSPITAL_COMMUNITY): Payer: Self-pay | Admitting: Emergency Medicine

## 2020-09-09 DIAGNOSIS — G8929 Other chronic pain: Secondary | ICD-10-CM

## 2020-09-09 DIAGNOSIS — M5441 Lumbago with sciatica, right side: Secondary | ICD-10-CM | POA: Diagnosis not present

## 2020-09-09 MED ORDER — METHYLPREDNISOLONE 4 MG PO TBPK: ORAL_TABLET | ORAL | 0 refills | Status: DC

## 2020-09-09 MED ORDER — CYCLOBENZAPRINE HCL 5 MG PO TABS: 5.0000 mg | ORAL_TABLET | Freq: Every day | ORAL | 0 refills | Status: DC

## 2020-09-09 NOTE — ED Triage Notes (Signed)
Pt c/o lower right sided back pain. She states this is a chronic problem she has been in about 6 [facility] over the years. 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 Pt states the pain radiates down to her hip and knee. Pt states she is not taking any oral medication but she does use tiger balm rub. She states she went to see a chiropractor but he is requesting xrays be done first.

## 2020-09-09 NOTE — Discharge Instructions (Signed)
Light and regular activity as tolerated.  See exercises provided.  Heat application while active can help with muscle spasms.  Sleep with pillow under your knees.   Medrol pack to help with pain and symptoms.  Flexeril at night as a muscle relaxer. May cause drowsiness. Please do not take if driving or drinking alcohol.   Please establish with a primary care provider for long term management.

## 2020-09-09 NOTE — ED Provider Notes (Signed)
[facility]    CSN: [id number] Arrival date & time: 09/09/20  1134      History   Chief Complaint Chief Complaint  Patient presents with   Back Pain    HPI Lauren Mcconnell is a 29 y.o. female.   Lauren Mcconnell presents with complaints of low back pain, worse to the right side, which has been worsening over the past few days. Pain can radiate into right buttocks and thigh. No numbness or tingling. No saddle symptoms. No loss of bladder or bowel. Does a lot of heavy lifting at work which she feels contributes to pain, had to leave work yesterday. Hasn't taken any medications for pain.     ROS per HPI, negative if not otherwise mentioned.      Past Medical History:  Diagnosis Date   Anxiety    Asthma    Gonorrhea     Patient Active Problem List   Diagnosis Date Noted   SVD (spontaneous vaginal delivery) 06/02/2018   Group B streptococcal infection during pregnancy 05/22/2018   Limited prenatal care 04/29/2018   Supervision of other normal pregnancy, antepartum 12/28/2017    Past Surgical History:  Procedure Laterality Date   DILATION AND CURETTAGE OF UTERUS     x2 for EAB    OB History    Gravida  9   Para  1   Term  1   Preterm  0   AB  8   Living  1     SAB  3   TAB  5   Ectopic  0   Multiple  0   Live Births  1        Obstetric Comments  G1: 2010 EAB via D&C 50m G2: 2012 EAB 8wks pills G3: unsure date 14wks EAB via D&C G4: unsure date 9wks EAB pills G5: 2018 missed AB at 8wks         Home Medications    Prior to Admission medications   Medication Sig Start Date End Date Taking? Authorizing Provider  cyclobenzaprine (FLEXERIL) 5 MG tablet Take 1 tablet (5 mg total) by mouth at bedtime. 09/09/20   Georgetta Haber, NP  erythromycin ophthalmic ointment Place a 1/2 inch ribbon of ointment into the lower eyelid. Apply 4 times a day for 5 days. 06/14/20   Joy, Shawn C, PA-C  ibuprofen (ADVIL) 200 MG tablet Take 200  mg by mouth every 6 (six) hours as needed for headache (pain).    [provider]  methylPREDNISolone (MEDROL DOSEPAK) 4 MG TBPK tablet Per box instructions 09/09/20   Georgetta Haber, NP  Prenatal Vit-Fe Fumarate-FA (PRENATAL MULTIVITAMIN) TABS tablet Take 1 tablet by mouth at bedtime.    [provider]    Family History Family History  Problem Relation Age of Onset   Hypertension Mother    Diabetes Maternal Grandmother    Hypertension Maternal Grandmother    Alcohol abuse Neg Hx    Arthritis Neg Hx    Asthma Neg Hx    Birth defects Neg Hx    Cancer Neg Hx    COPD Neg Hx    Depression Neg Hx    Drug abuse Neg Hx    Early death Neg Hx    Hearing loss Neg Hx    Heart disease Neg Hx    Hyperlipidemia Neg Hx    Kidney disease Neg Hx    Learning disabilities Neg Hx    Mental illness Neg Hx  Mental retardation Neg Hx    Miscarriages / Stillbirths Neg Hx    Stroke Neg Hx    Vision loss Neg Hx    Varicose Veins Neg Hx     Social History Social History   Tobacco Use   Smoking status: Never Smoker   Smokeless tobacco: Never Used  Substance Use Topics   Alcohol use: No    Comment: none with pregnancy   Drug use: Not Currently    Types: Marijuana    Comment: a year since last     Allergies   Aspirin   Review of Systems Review of Systems   Physical Exam Triage Vital Signs ED Triage Vitals  Enc Vitals Group     BP 09/09/20 1308 111/82     Pulse Rate 09/09/20 1308 76     Resp 09/09/20 1308 14     Temp 09/09/20 1308 98.4 F (36.9 C)     Temp src --      SpO2 09/09/20 1308 100 %     Weight --      Height --      Head Circumference --      Peak Flow --      Pain Score 09/09/20 1305 4     Pain Loc --      Pain Edu? --      Excl. in GC? --    No data found.  Updated Vital Signs BP 111/82 (BP Location: Left Arm)    Pulse 76    Temp 98.4 F (36.9 C)    Resp 14    LMP 08/11/2020    SpO2 100%    Physical  Exam Constitutional:      General: She is not in acute distress.    Appearance: She is well-developed.  Cardiovascular:     Rate and Rhythm: Normal rate.  Pulmonary:     Effort: Pulmonary effort is normal.  Musculoskeletal:     Lumbar back: Tenderness present. Normal range of motion. Positive right straight leg raise test.     Comments: Pain to right low back with right hip flexion and mild with straight leg raise; strength equal bilaterally; gross sensation intact to lower extremities   Skin:    General: Skin is warm and dry.  Neurological:     Mental Status: She is alert and oriented to person, place, and time.      UC Treatments / Results  Labs (all labs ordered are listed, but only abnormal results are displayed) Labs Reviewed - No data to display  EKG   Radiology No results found.  Procedures Procedures (including critical care time)  Medications Ordered in UC Medications - No data to display  Initial Impression / Assessment and Plan / UC Course  I have reviewed the triage vital signs and the nursing notes.  Pertinent labs & imaging results that were available during my care of the patient were reviewed by me and considered in my medical decision making (see chart for details).    Acute on chronic low back pain which radiates to buttocks and thighs. Heavy lifting at work. Pain management and expected course of rehab discussed. Patient verbalized understanding and agreeable to plan.  Ambulatory out of clinic without difficulty.     Final Clinical Impressions(s) / UC Diagnoses   Final diagnoses:  Chronic right-sided low back pain with right-sided sciatica     Discharge Instructions     Light and regular activity as tolerated.  See exercises provided.  Heat application while active can help with muscle spasms.  Sleep with pillow under your knees.   Medrol pack to help with pain and symptoms.  Flexeril at night as a muscle relaxer. May cause drowsiness.  Please do not take if driving or drinking alcohol.   Please establish with a primary care provider for long term management.     ED Prescriptions    Medication Sig Dispense Auth. Provider   methylPREDNISolone (MEDROL DOSEPAK) 4 MG TBPK tablet Per box instructions 21 tablet Treyvion Durkee B, NP   cyclobenzaprine (FLEXERIL) 5 MG tablet Take 1 tablet (5 mg total) by mouth at bedtime. 15 tablet Georgetta Haber, NP     PDMP not reviewed this encounter.   Georgetta Haber, NP 09/09/20 1517

## 2020-11-04 DIAGNOSIS — M545 Low back pain, unspecified: Secondary | ICD-10-CM | POA: Diagnosis not present

## 2021-04-14 ENCOUNTER — Encounter: Payer: Self-pay | Admitting: Family Medicine

## 2021-04-14 ENCOUNTER — Ambulatory Visit (INDEPENDENT_AMBULATORY_CARE_PROVIDER_SITE_OTHER): Payer: BC Managed Care – PPO | Admitting: Family Medicine

## 2021-04-14 ENCOUNTER — Other Ambulatory Visit (HOSPITAL_COMMUNITY)
Admission: RE | Admit: 2021-04-14 | Discharge: 2021-04-14 | Disposition: A | Discharge status: Home / Self Care | Payer: BC Managed Care – PPO | Source: Ambulatory Visit | Attending: Family Medicine | Admitting: Family Medicine

## 2021-04-14 VITALS — BP 111/76 | HR 70 | Wt 132.6 lb

## 2021-04-14 DIAGNOSIS — Z113 Encounter for screening for infections with a predominantly sexual mode of transmission: Secondary | ICD-10-CM | POA: Diagnosis not present

## 2021-04-14 DIAGNOSIS — Z124 Encounter for screening for malignant neoplasm of cervix: Secondary | ICD-10-CM | POA: Diagnosis not present

## 2021-04-14 DIAGNOSIS — Z01419 Encounter for gynecological examination (general) (routine) without abnormal findings: Secondary | ICD-10-CM

## 2021-04-14 NOTE — Patient Instructions (Addendum)
Come to see me in the [place] 28-31 Years Old, Female Preventive care refers to lifestyle choices and visits with your health care provider that can promote health and wellness. This includes:  A yearly physical exam. This is also called an annual wellness visit.  Regular dental and eye exams.  Immunizations.  Screening for certain conditions.  Healthy lifestyle choices, such as: ? Eating a healthy diet. ? Getting regular exercise. ? Not using drugs or products that contain nicotine and tobacco. ? Limiting alcohol use. What can I expect for my preventive care visit? Physical exam Your health care provider may check your:  Height and weight. These may be used to calculate your BMI (body mass index). BMI is a measurement that tells if you are at a healthy weight.  Heart rate and blood pressure.  Body temperature.  Skin for abnormal spots. Counseling Your health care provider may ask you questions about your:  Past medical problems.  Family's medical history.  Alcohol, tobacco, and drug use.  Emotional well-being.  Home life and relationship well-being.  Sexual activity.  Diet, exercise, and sleep habits.  Work and work Statistician.  Access to firearms.  Method of birth control.  Menstrual cycle.  Pregnancy history. What immunizations do I need? Vaccines are usually given at various ages, according to a schedule. Your health care provider will recommend vaccines for you based on your age, medical history, and lifestyle or other factors, such as travel or where you work.   What tests do I need? Blood tests  Lipid and cholesterol levels. These may be checked every 5 years starting at age 67.  Hepatitis C test.  Hepatitis B test. Screening  Diabetes screening. This is done by checking your blood sugar (glucose) after you have not eaten for a while (fasting).  STD (sexually transmitted disease) testing, if you  are at risk.  BRCA-related cancer screening. This may be done if you have a family history of breast, ovarian, tubal, or peritoneal cancers.  Pelvic exam and Pap test. This may be done every 3 years starting at age 3. Starting at age 71, this may be done every 5 years if you have a Pap test in combination with an HPV test. Talk with your health care provider about your test results, treatment options, and if necessary, the need for more tests.   Follow these instructions at home: Eating and drinking  Eat a healthy diet that includes fresh fruits and vegetables, whole grains, lean protein, and low-fat dairy products.  Take vitamin and mineral supplements as recommended by your health care provider.  Do not drink alcohol if: ? Your health care provider tells you not to drink. ? You are pregnant, may be pregnant, or are planning to become pregnant.  If you drink alcohol: ? Limit how much you have to 0-1 drink a day. ? Be aware of how much alcohol is in your drink. In the [place] one drink equals one 12 oz bottle of beer (355 mL), one 5 oz glass of wine (148 mL), or one 1 oz glass of hard liquor (44 mL).   Lifestyle  Take daily care of your teeth and gums. Brush your teeth every morning and night with fluoride toothpaste. Floss one time each day.  Stay active. Exercise for at least 30 minutes 5 or more days each week.  Do not use any products that contain nicotine or tobacco, such as cigarettes, e-cigarettes, and chewing tobacco. If you need  help quitting, ask your health care provider.  Do not use drugs.  If you are sexually active, practice safe sex. Use a condom or other form of protection to prevent STIs (sexually transmitted infections).  If you do not wish to become pregnant, use a form of birth control. If you plan to become pregnant, see your health care provider for a prepregnancy visit.  Find healthy ways to cope with stress, such as: ? Meditation, yoga, or listening to  music. ? Journaling. ? Talking to a trusted person. ? Spending time with friends and family. Safety  Always wear your seat belt while driving or riding in a vehicle.  Do not drive: ? If you have been drinking alcohol. Do not ride with someone who has been drinking. ? When you are tired or distracted. ? While texting.  Wear a helmet and other protective equipment during sports activities.  If you have firearms in your house, make sure you follow all gun safety procedures.  Seek help if you have been physically or sexually abused. What's next?  Go to your health care provider once a year for an annual wellness visit.  Ask your health care provider how often you should have your eyes and teeth checked.  Stay up to date on all vaccines. This information is not intended to replace advice given to you by your health care provider. Make sure you discuss any questions you have with your health care provider. Document Revised: 08/09/2020 Document Reviewed: 08/23/2018 Elsevier Patient Education  2021 [employer].

## 2021-04-14 NOTE — Progress Notes (Signed)
  Subjective:     Lauren Mcconnell is a 30 y.o. female and is here for a comprehensive physical exam. The patient reports no problems.     The following portions of the patient's history were reviewed and updated as appropriate: allergies, current medications, past family history, past medical history, past social history, past surgical history and problem list.  Review of Systems Pertinent items noted in HPI and remainder of comprehensive ROS otherwise negative.   Objective:    BP 111/76   Pulse 70   Wt 132 lb 9.6 oz (60.1 kg)   LMP 04/08/2021 (Exact Date)   Breastfeeding No   BMI 24.25 kg/m  General appearance: alert, cooperative and appears stated age Head: Normocephalic, without obvious abnormality, atraumatic Neck: no adenopathy, supple, symmetrical, trachea midline and thyroid not enlarged, symmetric, no tenderness/mass/nodules Lungs: clear to auscultation bilaterally Heart: regular rate and rhythm, S1, S2 normal, no murmur, click, rub or gallop Abdomen: soft, non-tender; bowel sounds normal; no masses,  no organomegaly Pelvic: cervix normal in appearance, external genitalia normal, no adnexal masses or tenderness, no cervical motion tenderness, uterus normal size, shape, and consistency and vagina normal without discharge Extremities: extremities normal, atraumatic, no cyanosis or edema Pulses: 2+ and symmetric Skin: Skin color, texture, turgor normal. No rashes or lesions Lymph nodes: Cervical, supraclavicular, and axillary nodes normal. Neurologic: Grossly normal    Assessment:    Healthy female exam.      Plan:  Papanicolaou smear for cervical cancer screening - Plan: Cytology - PAP( [place])  Screening examination for STD (sexually transmitted disease) - Plan: RPR, HIV Antibody (routine testing w rflx), Hepatitis B Surface AntiGEN, Hepatitis C Antibody  Encounter for gynecological examination without abnormal finding - Med clearance for BBL surgery--no  contraindications based on PMH, PSH, FH. Mom with DVT hx. Prevention risk. Will let us know what labs needed   Return in 1 year (on 04/14/2022).    See After Visit Summary for Counseling Recommendations

## 2021-04-15 LAB — RPR: RPR Ser Ql: NONREACTIVE

## 2021-04-15 LAB — HEPATITIS C ANTIBODY: Hep C Virus Ab: <0.1 s/co ratio (ref 0.0–0.9)

## 2021-04-15 LAB — HIV ANTIBODY (ROUTINE TESTING W REFLEX): HIV Screen 4th Generation wRfx: NONREACTIVE

## 2021-04-15 LAB — HEPATITIS B SURFACE ANTIGEN: Hepatitis B Surface Ag: NEGATIVE

## 2021-04-21 LAB — CYTOLOGY - PAP
Adequacy: ABSENT
Chlamydia: NEGATIVE
Comment: NEGATIVE
Comment: NEGATIVE
Comment: NEGATIVE
Comment: NEGATIVE
Comment: NORMAL
Diagnosis: NEGATIVE
HPV 16: NEGATIVE
HPV 18 / 45: NEGATIVE
High risk HPV: POSITIVE — AB
Neisseria Gonorrhea: NEGATIVE
Trichomonas: NEGATIVE

## 2021-04-22 ENCOUNTER — Telehealth: Payer: Self-pay

## 2021-04-22 NOTE — Telephone Encounter (Addendum)
-----   Message from Reva Bores, MD sent at 04/22/2021 10:05 AM EDT ----- 1 year pap recall  Left message that I calling with non urgent results. Please call the office and I will also send a MyChart with the results.  MyChart message sent.   Leonette Nutting  04/27/21

## 2021-09-21 ENCOUNTER — Ambulatory Visit: Payer: BC Managed Care – PPO | Admitting: Physician Assistant

## 2021-09-21 ENCOUNTER — Other Ambulatory Visit: Payer: Self-pay

## 2021-09-21 VITALS — BP 115/76 | HR 75 | Temp 98.2°F | Resp 18 | Ht 62.0 in | Wt 133.0 lb

## 2021-09-21 DIAGNOSIS — Z0289 Encounter for other administrative examinations: Secondary | ICD-10-CM | POA: Diagnosis not present

## 2021-09-21 DIAGNOSIS — J4520 Mild intermittent asthma, uncomplicated: Secondary | ICD-10-CM | POA: Diagnosis not present

## 2021-09-21 DIAGNOSIS — Z111 Encounter for screening for respiratory tuberculosis: Secondary | ICD-10-CM

## 2021-09-21 NOTE — Progress Notes (Signed)
New Patient Office Visit  Subjective:  Patient ID: Lauren Mcconnell, female    DOB: Apr 05, 1991  Age: 30 y.o. MRN: [id number]  CC:  Chief Complaint  Patient presents with   Employment Physical    HPI Lauren Mcconnell presents for a work physical.  Denies back pain, states that she does not take any medications.  Does endorse a history of asthma, does not currently have a primary care provider managing this condition.  No other concerns at this time   Past Medical History:  Diagnosis Date   Anxiety    Asthma    Gonorrhea     Past Surgical History:  Procedure Laterality Date   DILATION AND CURETTAGE OF UTERUS     x2 for EAB    Family History  Problem Relation Age of Onset   Hypertension Mother    Diabetes Maternal Grandmother    Hypertension Maternal Grandmother    Alcohol abuse Neg Hx    Arthritis Neg Hx    Asthma Neg Hx    Birth defects Neg Hx    Cancer Neg Hx    COPD Neg Hx    Depression Neg Hx    Drug abuse Neg Hx    Early death Neg Hx    Hearing loss Neg Hx    Heart disease Neg Hx    Hyperlipidemia Neg Hx    Kidney disease Neg Hx    Learning disabilities Neg Hx    Mental illness Neg Hx    Mental retardation Neg Hx    Miscarriages / Stillbirths Neg Hx    Stroke Neg Hx    Vision loss Neg Hx    Varicose Veins Neg Hx     Social History   Socioeconomic History   Marital status: Single    Spouse name: Not on file   Number of children: Not on file   Years of education: Not on file   Highest education level: Not on file  Occupational History   Not on file  Tobacco Use   Smoking status: Never   Smokeless tobacco: Never  Substance and Sexual Activity   Alcohol use: No    Comment: none with pregnancy   Drug use: Not Currently    Types: Marijuana    Comment: a year since last   Sexual activity: Not Currently    Birth control/protection: None  Other Topics Concern   Not on file  Social History Narrative   Not on file   Social Determinants of  Health   Financial Resource Strain: Not on file  Food Insecurity: No Food Insecurity   Worried About Programme researcher, broadcasting/film/video in the Last Year: Never true   Ran Out of Food in the Last Year: Never true  Transportation Needs: No Transportation Needs   Lack of Transportation (Medical): No   Lack of Transportation (Non-Medical): No  Physical Activity: Not on file  Stress: Not on file  Social Connections: Not on file  Intimate Partner Violence: Not on file    ROS Review of Systems  Constitutional: Negative.   HENT: Negative.    Eyes: Negative.   Respiratory:  Negative for cough, shortness of breath and wheezing.   Cardiovascular:  Negative for chest pain.  Gastrointestinal: Negative.   Endocrine: Negative.   Genitourinary: Negative.   Musculoskeletal:  Negative for back pain and gait problem.  Skin: Negative.   Allergic/Immunologic: Negative.   Neurological: Negative.   Hematological: Negative.   Psychiatric/Behavioral: Negative.     Objective:  Today's Vitals: BP 115/76 (BP Location: Left Arm, Patient Position: Sitting, Cuff Size: Normal)   Pulse 75   Temp 98.2 F (36.8 C) (Oral)   Resp 18   Ht 5\' 2"  (1.575 m)   Wt 133 lb (60.3 kg)   LMP 09/20/2021   SpO2 95%   BMI 24.33 kg/m   Physical Exam Vitals and nursing note reviewed.  Constitutional:      Appearance: Normal appearance.  HENT:     Head: Normocephalic and atraumatic.     Right Ear: External ear normal.     Left Ear: External ear normal.     Nose: Nose normal.     Mouth/Throat:     Mouth: Mucous membranes are moist.     Pharynx: Oropharynx is clear.  Eyes:     Extraocular Movements: Extraocular movements intact.     Conjunctiva/sclera: Conjunctivae normal.     Pupils: Pupils are equal, round, and reactive to light.  Cardiovascular:     Rate and Rhythm: Normal rate and regular rhythm.     Pulses: Normal pulses.     Heart sounds: Normal heart sounds.  Pulmonary:     Effort: Pulmonary effort is normal.      Breath sounds: Normal breath sounds.  Musculoskeletal:        General: Normal range of motion.     Cervical back: Normal range of motion and neck supple.  Skin:    General: Skin is warm and dry.  Neurological:     General: No focal deficit present.     Mental Status: She is alert and oriented to person, place, and time.  Psychiatric:        Mood and Affect: Mood normal.        Behavior: Behavior normal.        Thought Content: Thought content normal.        Judgment: Judgment normal.    Assessment & Plan:   Problem List Items Addressed This Visit   None Visit Diagnoses     PPD screening test    -  Primary   Relevant Orders   PPD (Completed)   Encounter for physical examination related to employment       Mild intermittent asthma without complication           Outpatient Encounter Medications as of 09/21/2021  Medication Sig   erythromycin ophthalmic ointment Place a 1/2 inch ribbon of ointment into the lower eyelid. Apply 4 times a day for 5 days. (Patient not taking: Reported on 09/21/2021)   Multiple Vitamins-Minerals (WOMENS ONE DAILY PO) Take by mouth. (Patient not taking: Reported on 09/21/2021)   No facility-administered encounter medications on file as of 09/21/2021.   1. Encounter for physical examination related to employment Paperwork completed on patient's behalf.  Patient encouraged to find a primary care provider, encourage patient to check with her insurance, patient to return to mobile unit in 2 days to repeat her TB test, will help patient find a provider if she is unable to determine on her own.  2. PPD screening test  - PPD  3. Mild intermittent asthma without complication Patient education given on supportive care   I have reviewed the patient's medical history (PMH, PSH, Social History, Family History, Medications, and allergies) , and have been updated if relevant. I spent 20 minutes reviewing chart and  face to face time with  patient.    Follow-up: Return if symptoms worsen or fail to improve.   Orvella Digiulio S  Mayers, PA-C

## 2021-09-21 NOTE — Patient Instructions (Signed)
Asthma, Adult °Asthma is a long-term (chronic) condition that causes recurrent episodes in which the airways become tight and narrow. The airways are the passages that lead from the nose and mouth down into the lungs. Asthma episodes, also called asthma attacks, can cause coughing, wheezing, shortness of breath, and chest pain. The airways can also fill with mucus. During an attack, it can be difficult to breathe. Asthma attacks can range from minor to life threatening. °Asthma cannot be cured, but medicines and lifestyle changes can help control it and treat acute attacks. °What are the causes? °This condition is believed to be caused by inherited (genetic) and environmental factors, but its exact cause is not known. °There are many things that can bring on an asthma attack or make asthma symptoms worse (triggers). Asthma triggers are different for each person. Common triggers include: °Mold. °Dust. °Cigarette smoke. °Cockroaches. °Things that can cause allergy symptoms (allergens), such as animal dander or pollen from trees or grass. °Air pollutants such as household cleaners, wood smoke, smog, or chemical odors. °Cold air, weather changes, and winds (which increase molds and pollen in the air). °Strong emotional expressions such as crying or laughing hard. °Stress. °Certain medicines (such as aspirin) or types of medicines (such as beta-blockers). °Sulfites in foods and drinks. Foods and drinks that may contain sulfites include dried fruit, potato chips, and sparkling grape juice. °Infections or inflammatory conditions such as the flu, a cold, or inflammation of the nasal membranes (rhinitis). °Gastroesophageal reflux disease (GERD). °Exercise or strenuous activity. °What are the signs or symptoms? °Symptoms of this condition may occur right after asthma is triggered or many hours later. Symptoms include: °Wheezing. This can sound like whistling when you breathe. °Excessive nighttime or early morning  coughing. °Frequent or severe coughing with a common cold. °Chest tightness. °Shortness of breath. °Tiredness (fatigue) with minimal activity. °How is this diagnosed? °This condition is diagnosed based on: °Your medical history. °A physical exam. °Tests, which may include: °Lung function studies and pulmonary studies (spirometry). These tests can evaluate the flow of air in your lungs. °Allergy tests. °Imaging tests, such as X-rays. °How is this treated? °There is no cure for this condition, but treatment can help control your symptoms. Treatment for asthma usually involves: °Identifying and avoiding your asthma triggers. °Using medicines to control your symptoms. Generally, two types of medicines are used to treat asthma: °Controller medicines. These help prevent asthma symptoms from occurring. They are usually taken every day. °Fast-acting reliever or rescue medicines. These quickly relieve asthma symptoms by widening the narrow and tight airways. They are used as needed and provide short-term relief. °Using supplemental oxygen. This may be needed during a severe episode. °Using other medicines, such as: °Allergy medicines, such as antihistamines, if your asthma attacks are triggered by allergens. °Immune medicines (immunomodulators). These are medicines that help control the immune system. °Creating an asthma action plan. An asthma action plan is a written plan for managing and treating your asthma attacks. This plan includes: °A list of your asthma triggers and how to avoid them. °Information about when medicines should be taken and when their dosage should be changed. °Instructions about using a device called a peak flow meter. A peak flow meter measures how well the lungs are working and the severity of your asthma. It helps you monitor your condition. °Follow these instructions at home: °Controlling your home environment °Control your home environment in the following ways to help avoid triggers and prevent  asthma attacks: °Change your heating   and air conditioning filter regularly. °Limit your use of fireplaces and wood stoves. °Get rid of pests (such as roaches and mice) and their droppings. °Throw away plants if you see mold on them. °Clean floors and dust surfaces regularly. Use unscented cleaning products. °Try to have someone else vacuum for you regularly. Stay out of rooms while they are being vacuumed and for a short while afterward. If you vacuum, use a dust mask from a hardware store, a double-layered or microfilter vacuum cleaner bag, or a vacuum cleaner with a HEPA filter. °Replace carpet with wood, tile, or vinyl flooring. Carpet can trap dander and dust. °Use allergy-proof pillows, mattress covers, and box spring covers. °Keep your bedroom a trigger-free room. °Avoid pets and keep windows closed when allergens are in the air. °Wash beddings every week in hot water and dry them in a dryer. °Use blankets that are made of polyester or cotton. °Clean bathrooms and kitchens with bleach. If possible, have someone repaint the walls in these rooms with mold-resistant paint. Stay out of the rooms that are being cleaned and painted. °Wash your hands often with soap and water. If soap and water are not available, use hand sanitizer. °Do not allow anyone to smoke in your home. °General instructions °Take over-the-counter and prescription medicines only as told by your health care provider. °Speak with your health care provider if you have questions about how or when to take the medicines. °Make note if you are requiring more frequent dosages. °Do not use any products that contain nicotine or tobacco, such as cigarettes and e-cigarettes. If you need help quitting, ask your health care provider. Also, avoid being exposed to secondhand smoke. °Use a peak flow meter as told by your health care provider. Record and keep track of the readings. °Understand and use the asthma action plan to help minimize, or stop an asthma  attack, without needing to seek medical care. °Make sure you stay up to date on your yearly vaccinations as told by your health care provider. This may include vaccines for the flu and pneumonia. °Avoid outdoor activities when allergen counts are high and when air quality is low. °Wear a ski mask that covers your nose and mouth during outdoor winter activities. Exercise indoors on cold days if you can. °Warm up before exercising, and take time for a cool-down period after exercise. °Keep all follow-up visits as told by your health care provider. This is important. °Where to find more information °For information about asthma, turn to the Centers for Disease Control and Prevention at www.cdc.gov/asthma/faqs °For air quality information, turn to AirNow at airnow.gov °Contact a health care provider if: °You have wheezing, shortness of breath, or a cough even while you are taking medicine to prevent attacks. °The mucus you cough up (sputum) is thicker than usual. °Your sputum changes from clear or white to yellow, green, gray, or bloody. °Your medicines are causing side effects, such as a rash, itching, swelling, or trouble breathing. °You need to use a reliever medicine more than 2-3 times a week. °Your peak flow reading is still at 50-79% of your personal best after following your action plan for 1 hour. °You have a fever. °Get help right away if: °You are getting worse and do not respond to treatment during an asthma attack. °You are short of breath when at rest or when doing very little physical activity. °You have difficulty eating, drinking, or talking. °You have chest pain or tightness. °You develop a fast heartbeat or   palpitations. °You have a bluish color to your lips or fingernails. °You are light-headed or dizzy, or you faint. °Your peak flow reading is less than 50% of your personal best. °You feel too tired to breathe normally. °Summary °Asthma is a long-term (chronic) condition that causes recurrent  episodes in which the airways become tight and narrow. These episodes can cause coughing, wheezing, shortness of breath, and chest pain. °Asthma cannot be cured, but medicines and lifestyle changes can help control it and treat acute attacks. °Make sure you understand how to avoid triggers and how and when to use your medicines. °Asthma attacks can range from minor to life threatening. Get help right away if you have an asthma attack and do not respond to treatment with your usual rescue medicines. °This information is not intended to replace advice given to you by your health care provider. Make sure you discuss any questions you have with your health care provider. °Document Revised: 09/11/2020 Document Reviewed: 04/15/2020 °Elsevier Patient Education © 2022 [employer] ° °

## 2021-09-23 ENCOUNTER — Ambulatory Visit: Payer: Medicaid Other | Admitting: *Deleted

## 2021-09-23 ENCOUNTER — Other Ambulatory Visit: Payer: Self-pay

## 2021-09-23 DIAGNOSIS — Z111 Encounter for screening for respiratory tuberculosis: Secondary | ICD-10-CM

## 2021-09-23 LAB — TB SKIN TEST
Induration: 0 mm
TB Skin Test: NEGATIVE

## 2021-09-23 NOTE — Progress Notes (Signed)
Patient received printout and completed paperwork for PPD results.

## 2021-10-14 ENCOUNTER — Other Ambulatory Visit: Payer: Self-pay

## 2021-10-14 ENCOUNTER — Emergency Department (HOSPITAL_COMMUNITY)
Admission: EM | Admit: 2021-10-14 | Discharge: 2021-10-14 | Disposition: A | Discharge status: Home / Self Care | Payer: Medicaid Other | Attending: Emergency Medicine | Admitting: Emergency Medicine

## 2021-10-14 ENCOUNTER — Emergency Department (HOSPITAL_COMMUNITY): Payer: Medicaid Other

## 2021-10-14 ENCOUNTER — Encounter (HOSPITAL_COMMUNITY): Payer: Self-pay

## 2021-10-14 DIAGNOSIS — R0689 Other abnormalities of breathing: Secondary | ICD-10-CM | POA: Diagnosis not present

## 2021-10-14 DIAGNOSIS — M79605 Pain in left leg: Secondary | ICD-10-CM | POA: Diagnosis not present

## 2021-10-14 DIAGNOSIS — T1490XA Injury, unspecified, initial encounter: Secondary | ICD-10-CM

## 2021-10-14 DIAGNOSIS — S299XXA Unspecified injury of thorax, initial encounter: Secondary | ICD-10-CM | POA: Diagnosis not present

## 2021-10-14 DIAGNOSIS — Z23 Encounter for immunization: Secondary | ICD-10-CM | POA: Diagnosis not present

## 2021-10-14 DIAGNOSIS — S199XXA Unspecified injury of neck, initial encounter: Secondary | ICD-10-CM | POA: Diagnosis not present

## 2021-10-14 DIAGNOSIS — M542 Cervicalgia: Secondary | ICD-10-CM | POA: Insufficient documentation

## 2021-10-14 DIAGNOSIS — M79632 Pain in left forearm: Secondary | ICD-10-CM | POA: Diagnosis not present

## 2021-10-14 DIAGNOSIS — J45909 Unspecified asthma, uncomplicated: Secondary | ICD-10-CM | POA: Diagnosis not present

## 2021-10-14 DIAGNOSIS — Q7649 Other congenital malformations of spine, not associated with scoliosis: Secondary | ICD-10-CM | POA: Diagnosis not present

## 2021-10-14 DIAGNOSIS — M79602 Pain in left arm: Secondary | ICD-10-CM | POA: Diagnosis not present

## 2021-10-14 DIAGNOSIS — S0990XA Unspecified injury of head, initial encounter: Secondary | ICD-10-CM | POA: Diagnosis not present

## 2021-10-14 DIAGNOSIS — R102 Pelvic and perineal pain: Secondary | ICD-10-CM | POA: Diagnosis not present

## 2021-10-14 DIAGNOSIS — R079 Chest pain, unspecified: Secondary | ICD-10-CM | POA: Diagnosis not present

## 2021-10-14 DIAGNOSIS — Y92410 Unspecified street and highway as the place of occurrence of the external cause: Secondary | ICD-10-CM | POA: Diagnosis not present

## 2021-10-14 LAB — COMPREHENSIVE METABOLIC PANEL
ALT: 30 U/L (ref 0–44)
AST: 26 U/L (ref 15–41)
Albumin: 3.9 g/dL (ref 3.5–5.0)
Alkaline Phosphatase: 55 U/L (ref 38–126)
Anion gap: 6 (ref 5–15)
BUN: 7 mg/dL (ref 6–20)
CO2: 24 mmol/L (ref 22–32)
Calcium: 8.8 mg/dL — ABNORMAL LOW (ref 8.9–10.3)
Chloride: 108 mmol/L (ref 98–111)
Creatinine, Ser: 0.67 mg/dL (ref 0.44–1.00)
GFR, Estimated: >60 mL/min (ref >60)
Glucose, Bld: 89 mg/dL (ref 70–99)
Potassium: 3.7 mmol/L (ref 3.5–5.1)
Sodium: 138 mmol/L (ref 135–145)
Total Bilirubin: 0.8 mg/dL (ref 0.3–1.2)
Total Protein: 6.8 g/dL (ref 6.5–8.1)

## 2021-10-14 LAB — CBC
HCT: 37.6 % (ref 36.0–46.0)
Hemoglobin: 12.8 g/dL (ref 12.0–15.0)
MCH: 31.2 pg (ref 26.0–34.0)
MCHC: 34 g/dL (ref 30.0–36.0)
MCV: 91.7 fL (ref 80.0–100.0)
Platelets: 267 10*3/uL (ref 150–400)
RBC: 4.1 MIL/uL (ref 3.87–5.11)
RDW: 11.7 % (ref 11.5–15.5)
WBC: 4.3 10*3/uL (ref 4.0–10.5)
nRBC: 0 % (ref 0.0–0.2)

## 2021-10-14 LAB — PROTIME-INR
INR: 1.1 (ref 0.8–1.2)
Prothrombin Time: 14.1 seconds (ref 11.4–15.2)

## 2021-10-14 LAB — I-STAT BETA HCG BLOOD, ED (MC, WL, AP ONLY): I-stat hCG, quantitative: <5 m[IU]/mL (ref <5)

## 2021-10-14 LAB — LACTIC ACID, PLASMA: Lactic Acid, Venous: 1.5 mmol/L (ref 0.5–1.9)

## 2021-10-14 MED ORDER — TETANUS-DIPHTH-ACELL PERTUSSIS 5-2.5-18.5 LF-MCG/0.5 IM SUSY
0.5000 mL | PREFILLED_SYRINGE | Freq: Once | INTRAMUSCULAR | Status: AC
  Administered 2021-10-14: 0.5 mL via INTRAMUSCULAR
  Filled 2021-10-14: qty 0.5

## 2021-10-14 NOTE — ED Notes (Signed)
Reviewed discharge instructions with patient. Follow-up care and pain management reviewed. Patient verbalized understanding. Patient A&Ox4, VSS, and ambulatory with steady gait upon discharge. 

## 2021-10-14 NOTE — ED Notes (Signed)
Pt transported to xray 

## 2021-10-14 NOTE — Discharge Instructions (Signed)
All your lab and imaging tests today were reassuring.  You do not have any acute traumatic injuries.  Take Tylenol 1000 mg every 4 hours and ibuprofen 600 mg every 6 hours as needed for pain.  Follow-up with your primary care doctor.  Return to the emergency department for any new or worsening symptoms.
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
## 2021-10-14 NOTE — ED Notes (Signed)
Pt ambulated w/ stand-by assist to wheelchair
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
## 2021-10-15 ENCOUNTER — Telehealth: Payer: Self-pay

## 2021-10-15 NOTE — Telephone Encounter (Signed)
Transition Care Management Unsuccessful Follow-up Telephone Call  Date of discharge and from where:  10/14/2021-[facility]  Attempts:  1st Attempt  Reason for unsuccessful TCM follow-up call:  Left voice message

## 2021-10-16 NOTE — Telephone Encounter (Signed)
Transition Care Management Unsuccessful Follow-up Telephone Call  Date of discharge and from where:  10/14/2021 from [facility]  Attempts:  2nd Attempt  Reason for unsuccessful TCM follow-up call:  Left voice message

## 2021-10-18 NOTE — Telephone Encounter (Signed)
Transition Care Management Unsuccessful Follow-up Telephone Call  Date of discharge and from where:  10/14/2021 from [facility]  Attempts:  3rd Attempt  Reason for unsuccessful TCM follow-up call:  Unable to reach patient
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
## 2021-11-29 ENCOUNTER — Encounter: Payer: Self-pay | Admitting: Physician Assistant

## 2022-08-08 ENCOUNTER — Encounter: Payer: Self-pay | Admitting: Family Medicine

## 2022-08-08 ENCOUNTER — Ambulatory Visit: Payer: Medicaid Other | Admitting: Family Medicine

## 2022-08-08 NOTE — Progress Notes (Signed)
Patient did not keep appointment today. She may call to reschedule.  

## 2022-11-28 ENCOUNTER — Other Ambulatory Visit: Payer: Self-pay

## 2022-11-28 ENCOUNTER — Encounter (HOSPITAL_COMMUNITY): Payer: Self-pay | Admitting: Emergency Medicine

## 2022-11-28 ENCOUNTER — Emergency Department (HOSPITAL_COMMUNITY)
Admission: EM | Admit: 2022-11-28 | Discharge: 2022-11-28 | Disposition: A | Discharge status: Home / Self Care | Payer: Self-pay | Attending: Emergency Medicine | Admitting: Emergency Medicine

## 2022-11-28 DIAGNOSIS — N899 Noninflammatory disorder of vagina, unspecified: Secondary | ICD-10-CM | POA: Insufficient documentation

## 2022-11-28 DIAGNOSIS — R102 Pelvic and perineal pain unspecified side: Secondary | ICD-10-CM

## 2022-11-28 DIAGNOSIS — J029 Acute pharyngitis, unspecified: Secondary | ICD-10-CM

## 2022-11-28 DIAGNOSIS — N898 Other specified noninflammatory disorders of vagina: Secondary | ICD-10-CM

## 2022-11-28 LAB — URINALYSIS, ROUTINE W REFLEX MICROSCOPIC
Bilirubin Urine: NEGATIVE
Glucose, UA: NEGATIVE mg/dL
Hgb urine dipstick: NEGATIVE
Ketones, ur: NEGATIVE mg/dL
Leukocytes,Ua: NEGATIVE
Nitrite: NEGATIVE
Protein, ur: NEGATIVE mg/dL
Specific Gravity, Urine: 1.026 (ref 1.005–1.030)
pH: 5 (ref 5.0–8.0)

## 2022-11-28 LAB — WET PREP, GENITAL
Clue Cells Wet Prep HPF POC: NONE SEEN
Sperm: NONE SEEN
Trich, Wet Prep: NONE SEEN
WBC, Wet Prep HPF POC: <10 (ref <10)
Yeast Wet Prep HPF POC: NONE SEEN

## 2022-11-28 LAB — HIV ANTIBODY (ROUTINE TESTING W REFLEX): HIV Screen 4th Generation wRfx: NONREACTIVE

## 2022-11-28 MED ORDER — VALACYCLOVIR HCL 1 G PO TABS: 1000.0000 mg | ORAL_TABLET | Freq: Two times a day (BID) | ORAL | 0 refills | Status: AC

## 2022-11-28 NOTE — ED Provider Notes (Signed)
I saw and evaluated the patient, reviewed the resident's note and I agree with the findings and plan.   31 year old female who presents with vaginal discharge x 2 days along with sore throat.  Has had unprotected sex in the past.  Patient will need pelvic exam to look for STIs as well as swabs   Lorre Nick, MD 11/28/22 [phone]

## 2022-11-28 NOTE — ED Notes (Signed)
Pt d/c home per MD order. Discharge summary reviewed, pt verbalizes understanding. Ambulatory off unit. No s/s of acute distress noted at discharge.  °

## 2022-11-28 NOTE — ED Provider Notes (Signed)
[facility] EMERGENCY DEPARTMENT Provider Note   CSN: [id number] Arrival date & time: 11/28/22  0830     History  No chief complaint on file.   Lauren Mcconnell is a 31 y.o. female.  The history is provided by the patient. No language interpreter was used.   Patient presenting with pelvic pain and new vaginal lesions that started about 1 week ago. She initially noticed two small erythematous flat blisters on her vaginal area which have since resolved but she still feels burning and discomfort. She reports she recently resumed sexual activity about 2 weeks ago with a female partner after a 2 year hiatus. Her partner has had other sexual partners and she is unsure whether he has a history of STI. She did participate in oral sex and reports she has felt some sore throat since  then. Denies vaginal discharge, fever, chills. No personal history of STI.    Home Medications Prior to Admission medications   Medication Sig Start Date End Date Taking? Authorizing Provider  valACYclovir (VALTREX) 1000 MG tablet Take 1 tablet (1,000 mg total) by mouth 2 (two) times daily for 7 days. 11/28/22 12/05/22 Yes Littie Deeds, MD  erythromycin ophthalmic ointment Place a 1/2 inch ribbon of ointment into the lower eyelid. Apply 4 times a day for 5 days. Patient not taking: Reported on 09/21/2021 06/14/20   Anselm Pancoast, PA-C  Multiple Vitamins-Minerals (WOMENS ONE DAILY PO) Take by mouth. Patient not taking: Reported on 09/21/2021    [provider]      Allergies    Aspirin    Review of Systems   Review of Systems  Physical Exam Updated Vital Signs BP 109/66   Pulse (!) 58   Temp 98.3 F (36.8 C) (Oral)   Resp 16   Ht 5\' 2"  (1.575 m)   Wt 60 kg   LMP 11/10/2022 (Approximate)   SpO2 100%   BMI 24.19 kg/m  Physical Exam Exam conducted with a chaperone present.  Constitutional:      General: She is not in acute distress. Cardiovascular:     Rate and Rhythm: Normal  rate.  Pulmonary:     Effort: Pulmonary effort is normal. No respiratory distress.  Genitourinary:    Comments: There is mild tenderness with palpation of the area superior to the clitoral area without any lesions visualized. Speculum exam deferred due to constraints of examination room. Musculoskeletal:     Cervical back: Neck supple.  Neurological:     Mental Status: She is alert.     ED Results / Procedures / Treatments   Labs (all labs ordered are listed, but only abnormal results are displayed) Labs Reviewed  URINALYSIS, ROUTINE W REFLEX MICROSCOPIC - Abnormal; Notable for the following components:      Result Value   APPearance HAZY (*)    All other components within normal limits  WET PREP, GENITAL  GONOCOCCUS CULTURE  CHLAMYDIA CULTURE  RPR  HIV ANTIBODY (ROUTINE TESTING W REFLEX)  POC URINE PREG, ED  CERVICOVAGINAL ANCILLARY ONLY    EKG None  Radiology No results found.  Procedures Procedures    Medications Ordered in ED Medications - No data to display  ED Course/ Medical Decision Making/ A&P                           Medical Decision Making Amount and/or Complexity of Data Reviewed Labs: ordered.  Risk Prescription drug management.  31 yo F with no prior history of STI presenting with new vaginal lesions, pelvic pain, and sore throat after recent unprotected sexual intercourse. Concern for possible genital herpes based on history. Due to constraints of the room, unable to perform full pelvic exam so limited external exam was performed and patient performed self swabs. No obvious external lesions visualized. Also obtained GC and chlamydia throat cultures given recent oral sexual intercourse and throat discomfort. Though no external lesions visualized now, there is high clinical suspicion for genital herpes based on history so will empirically treat. UA does not indicate UTI. Wet prep negative for yeast, trichomonas, and BV. Low suspicion for PID at this  time in the absence of vaginal discharge and fever, will await results prior to other treatment.   Final Clinical Impression(s) / ED Diagnoses Final diagnoses:  Vaginal lesion  Pelvic pain  Sore throat    Rx / DC Orders ED Discharge Orders          Ordered    valACYclovir (VALTREX) 1000 MG tablet  2 times daily        11/28/22 1025              Littie Deeds, MD 11/28/22 1130    Lorre Nick, MD 11/29/22 [phone]

## 2022-11-28 NOTE — Discharge Instructions (Addendum)
Take valacyclovir as instructed due to concern for genital herpes.  Talk to your OB/GYN doctor about getting a prophylactic antiviral prescription.

## 2022-11-28 NOTE — ED Triage Notes (Signed)
Pt endorses her "private area feels like its on fire" for 2 days. Pt also had vaginal blisters for 2 days that have gone away.

## 2022-11-29 LAB — RPR: RPR Ser Ql: NONREACTIVE

## 2022-11-29 LAB — CERVICOVAGINAL ANCILLARY ONLY
Bacterial Vaginitis (gardnerella): POSITIVE — AB
Candida Glabrata: NEGATIVE
Candida Vaginitis: NEGATIVE
Chlamydia: NEGATIVE
Comment: NEGATIVE
Comment: NEGATIVE
Comment: NEGATIVE
Comment: NEGATIVE
Comment: NEGATIVE
Comment: NORMAL
Neisseria Gonorrhea: NEGATIVE
Trichomonas: NEGATIVE

## 2022-11-30 LAB — GONOCOCCUS CULTURE: Culture: NO GROWTH

## 2023-09-24 ENCOUNTER — Encounter: Payer: Self-pay | Admitting: Family Medicine

## 2023-09-24 ENCOUNTER — Telehealth: Payer: No Typology Code available for payment source | Admitting: Family

## 2023-09-24 DIAGNOSIS — R102 Pelvic and perineal pain: Secondary | ICD-10-CM

## 2023-09-24 NOTE — Progress Notes (Signed)
I can not diagnose herpes without a picture and you need further testing, I feel your condition warrants further evaluation and I recommend that you be seen in a face to face visit.   NOTE: There will be NO CHARGE for this eVisit   If you are having a true medical emergency please call 911.      For an urgent face to face visit, [facility] has eight urgent care centers for your convenience:   NEW!! [facility] Health Urgent Care Center at [facility] Get Driving Directions [phone] [address] [place], [address]    [facility] Health Urgent Care Center at [facility] Get Driving Directions [phone] [address] [place], [place] [address]   [facility] Health Urgent Care Center [facility]) Get Driving Directions [phone] [address] [place], [place] [address]  [facility] Health Urgent Care Center [facility]) Get Driving Directions [phone] [address] [place],  [place]  [address]  [facility] Health Urgent Care Center [facility] - at [employer]  [phone] [address] [place],  [place] [address]   [facility] Health Urgent Care at [facility] Get Driving Directions [phone] [address] [place], [place] [address]   [facility] Health Urgent Care at [facility] Get Driving Directions  [phone] [address] [place], [place] [address]   [facility] Health Urgent Care at [facility] Directions [phone] [address], [address] [place], [place] [address]  Your MyChart E-visit questionnaire answers were reviewed by a board certified advanced clinical practitioner to complete your personal care plan based on your specific symptoms.  Thank you for using e-Visits.

## 2023-09-25 ENCOUNTER — Other Ambulatory Visit: Payer: Self-pay | Admitting: *Deleted

## 2023-09-25 DIAGNOSIS — B009 Herpesviral infection, unspecified: Secondary | ICD-10-CM

## 2023-09-25 MED ORDER — VALACYCLOVIR HCL 500 MG PO TABS: 1000.0000 mg | ORAL_TABLET | Freq: Every day | ORAL | 2 refills | Status: AC

## 2023-12-20 ENCOUNTER — Other Ambulatory Visit: Payer: Self-pay | Admitting: Medical Genetics

## 2024-01-26 ENCOUNTER — Other Ambulatory Visit (HOSPITAL_COMMUNITY): Payer: Medicaid Other | Attending: Medical Genetics

## 2024-03-11 ENCOUNTER — Encounter: Payer: Self-pay | Admitting: Family Medicine

## 2024-03-13 ENCOUNTER — Other Ambulatory Visit: Payer: Self-pay | Admitting: Lactation Services

## 2024-03-13 MED ORDER — VALACYCLOVIR HCL 500 MG PO TABS: 500.0000 mg | ORAL_TABLET | Freq: Two times a day (BID) | ORAL | 99 refills | Status: AC

## 2024-03-13 NOTE — Progress Notes (Signed)
 Valtrex reordered for current outbreak per standing order.

## 2024-07-21 ENCOUNTER — Telehealth: Admitting: Physician Assistant

## 2024-07-21 DIAGNOSIS — N760 Acute vaginitis: Secondary | ICD-10-CM | POA: Diagnosis not present

## 2024-07-21 MED ORDER — FLUCONAZOLE 150 MG PO TABS: 150.0000 mg | ORAL_TABLET | Freq: Once | ORAL | 0 refills | Status: AC

## 2024-07-21 NOTE — Progress Notes (Signed)
 I have spent 5 minutes in review of e-visit questionnaire, review and updating patient chart, medical decision making and response to patient.   Laure Kidney, PA-C

## 2024-07-21 NOTE — Progress Notes (Signed)

## 2024-09-10 ENCOUNTER — Emergency Department (HOSPITAL_COMMUNITY)

## 2024-09-10 ENCOUNTER — Other Ambulatory Visit: Payer: Self-pay

## 2024-09-10 ENCOUNTER — Emergency Department (HOSPITAL_COMMUNITY)
Admission: EM | Admit: 2024-09-10 | Discharge: 2024-09-10 | Disposition: A | Discharge status: Home / Self Care | Attending: Emergency Medicine | Admitting: Emergency Medicine

## 2024-09-10 ENCOUNTER — Encounter (HOSPITAL_COMMUNITY): Payer: Self-pay

## 2024-09-10 DIAGNOSIS — J4541 Moderate persistent asthma with (acute) exacerbation: Secondary | ICD-10-CM | POA: Diagnosis not present

## 2024-09-10 DIAGNOSIS — R059 Cough, unspecified: Secondary | ICD-10-CM | POA: Diagnosis not present

## 2024-09-10 DIAGNOSIS — R0602 Shortness of breath: Secondary | ICD-10-CM | POA: Diagnosis present

## 2024-09-10 DIAGNOSIS — E876 Hypokalemia: Secondary | ICD-10-CM | POA: Diagnosis not present

## 2024-09-10 DIAGNOSIS — D72829 Elevated white blood cell count, unspecified: Secondary | ICD-10-CM | POA: Diagnosis not present

## 2024-09-10 LAB — CBC
HCT: 39.5 % (ref 36.0–46.0)
Hemoglobin: 13.1 g/dL (ref 12.0–15.0)
MCH: 30.4 pg (ref 26.0–34.0)
MCHC: 33.2 g/dL (ref 30.0–36.0)
MCV: 91.6 fL (ref 80.0–100.0)
Platelets: 246 K/uL (ref 150–400)
RBC: 4.31 MIL/uL (ref 3.87–5.11)
RDW: 11.8 % (ref 11.5–15.5)
WBC: 12.1 K/uL — ABNORMAL HIGH (ref 4.0–10.5)
nRBC: 0 % (ref 0.0–0.2)

## 2024-09-10 LAB — RESP PANEL BY RT-PCR (RSV, FLU A&B, COVID)  RVPGX2
Influenza A by PCR: NEGATIVE
Influenza B by PCR: NEGATIVE
Resp Syncytial Virus by PCR: NEGATIVE
SARS Coronavirus 2 by RT PCR: NEGATIVE

## 2024-09-10 LAB — BASIC METABOLIC PANEL WITH GFR
Anion gap: 16 — ABNORMAL HIGH (ref 5–15)
BUN: 8 mg/dL (ref 6–20)
CO2: 18 mmol/L — ABNORMAL LOW (ref 22–32)
Calcium: 8.8 mg/dL — ABNORMAL LOW (ref 8.9–10.3)
Chloride: 105 mmol/L (ref 98–111)
Creatinine, Ser: 0.66 mg/dL (ref 0.44–1.00)
GFR, Estimated: >60 mL/min (ref >60)
Glucose, Bld: 136 mg/dL — ABNORMAL HIGH (ref 70–99)
Potassium: 2.8 mmol/L — ABNORMAL LOW (ref 3.5–5.1)
Sodium: 139 mmol/L (ref 135–145)

## 2024-09-10 LAB — HCG, SERUM, QUALITATIVE: Preg, Serum: NEGATIVE

## 2024-09-10 MED ORDER — IPRATROPIUM-ALBUTEROL 0.5-2.5 (3) MG/3ML IN SOLN
3.0000 mL | Freq: Once | RESPIRATORY_TRACT | Status: AC
  Administered 2024-09-10: 3 mL via RESPIRATORY_TRACT
  Filled 2024-09-10: qty 6

## 2024-09-10 MED ORDER — MAGNESIUM SULFATE 2 GM/50ML IV SOLN
2.0000 g | Freq: Once | INTRAVENOUS | Status: AC
  Administered 2024-09-10: 2 g via INTRAVENOUS
  Filled 2024-09-10: qty 50

## 2024-09-10 MED ORDER — IPRATROPIUM-ALBUTEROL 0.5-2.5 (3) MG/3ML IN SOLN
3.0000 mL | Freq: Once | RESPIRATORY_TRACT | Status: AC
  Administered 2024-09-10: 3 mL via RESPIRATORY_TRACT

## 2024-09-10 MED ORDER — ALBUTEROL SULFATE HFA 108 (90 BASE) MCG/ACT IN AERS
4.0000 | INHALATION_SPRAY | Freq: Once | RESPIRATORY_TRACT | Status: AC
  Administered 2024-09-10: 4 via RESPIRATORY_TRACT
  Filled 2024-09-10: qty 6.7

## 2024-09-10 MED ORDER — AEROCHAMBER PLUS FLO-VU MEDIUM MISC
1.0000 | Freq: Once | Status: AC
  Administered 2024-09-10: 1

## 2024-09-10 MED ORDER — POTASSIUM CHLORIDE CRYS ER 20 MEQ PO TBCR
40.0000 meq | EXTENDED_RELEASE_TABLET | Freq: Once | ORAL | Status: AC
  Administered 2024-09-10: 40 meq via ORAL
  Filled 2024-09-10: qty 2

## 2024-09-10 NOTE — ED Notes (Signed)
Covid test sent to the lab.

## 2024-09-10 NOTE — ED Provider Notes (Signed)
 [facility] EMERGENCY DEPARTMENT AT [facility] Provider Note   CSN: [id number] Arrival date & time: 09/10/24  0944     Patient presents with: Shortness of Breath   Lauren Mcconnell is a 33 y.o. female.  {Add pertinent medical, surgical, social history, OB history to HPI:32947}  Shortness of Breath      Prior to Admission medications   Medication Sig Start Date End Date Taking? Authorizing Provider  erythromycin  ophthalmic ointment Place a 1/2 inch ribbon of ointment into the lower eyelid. Apply 4 times a day for 5 days. Patient not taking: Reported on 09/21/2021 06/14/20   Zada Elouise BROCKS, PA-C  Multiple Vitamins-Minerals (WOMENS ONE DAILY PO) Take by mouth. Patient not taking: Reported on 09/21/2021    [provider]  valACYclovir  (VALTREX ) 500 MG tablet Take 2 tablets (1,000 mg total) by mouth daily. 09/25/23   Eldonna Suzen Octave, MD  valACYclovir  (VALTREX ) 500 MG tablet Take 1 tablet (500 mg total) by mouth 2 (two) times daily. 03/13/24   Eldonna Suzen Octave, MD    Allergies: Aspirin    Review of Systems  Respiratory:  Positive for shortness of breath.     Updated Vital Signs BP 128/78   Pulse (!) 103   Temp 97.8 F (36.6 C) (Oral)   Resp 19   SpO2 97%   Physical Exam  (all labs ordered are listed, but only abnormal results are displayed) Labs Reviewed  CBC - Abnormal; Notable for the following components:      Result Value   WBC 12.1 (*)    All other components within normal limits  BASIC METABOLIC PANEL WITH GFR - Abnormal; Notable for the following components:   Potassium 2.8 (*)    CO2 18 (*)    Glucose, Bld 136 (*)    Calcium 8.8 (*)    Anion gap 16 (*)    All other components within normal limits  RESP PANEL BY RT-PCR (RSV, FLU A&B, COVID)  RVPGX2  HCG, SERUM, QUALITATIVE    EKG: EKG Interpretation Date/Time:  Tuesday September 10 2024 09:59:25 EDT Ventricular Rate:  100 PR Interval:  147 QRS Duration:  98 QT  Interval:  341 QTC Calculation: 440 R Axis:   -5  Text Interpretation: Sinus tachycardia RSR' in V1 or V2, right VCD or RVH No previous ECGs available Confirmed by Zackowski, Scott [phone]) on 09/10/2024 10:02:28 AM  Radiology: DG Chest Portable 1 View Result Date: 09/10/2024 CLINICAL DATA:  Shortness of breath. EXAM: PORTABLE CHEST 1 VIEW COMPARISON:  10/14/2021 FINDINGS: The lungs are clear without focal pneumonia, edema, pneumothorax or pleural effusion. The cardiopericardial silhouette is within normal limits for size. No acute bony abnormality. Telemetry leads overlie the chest. IMPRESSION: No active disease. Electronically Signed   By: Camellia Candle M.D.   On: 09/10/2024 10:57    {Document cardiac monitor, telemetry assessment procedure when appropriate:32947} Procedures   Medications Ordered in the ED  magnesium  sulfate IVPB 2 g 50 mL (0 g Intravenous Stopped 09/10/24 1207)  ipratropium-albuterol  (DUONEB) 0.5-2.5 (3) MG/3ML nebulizer solution 3 mL (3 mLs Nebulization Given 09/10/24 1040)  ipratropium-albuterol  (DUONEB) 0.5-2.5 (3) MG/3ML nebulizer solution 3 mL (3 mLs Nebulization Given 09/10/24 1040)  potassium chloride  SA (KLOR-CON  M) CR tablet 40 mEq (40 mEq Oral Given 09/10/24 1204)  albuterol  (VENTOLIN  HFA) 108 (90 Base) MCG/ACT inhaler 4 puff (4 puffs Inhalation Given 09/10/24 1205)  AeroChamber Plus Flo-Vu Medium MISC 1 each (1 each Other Given 09/10/24 1207)      {  Click here for ABCD2, HEART and other calculators REFRESH Note before signing:1}                              Medical Decision Making Amount and/or Complexity of Data Reviewed Labs: ordered. Radiology: ordered.  Risk Prescription drug management.   ***  {Document critical care time when appropriate  Document review of labs and clinical decision tools ie CHADS2VASC2, etc  Document your independent review of radiology images and any outside records  Document your discussion with family members, caretakers and  with consultants  Document social determinants of health affecting pt's care  Document your decision making why or why not admission, treatments were needed:32947:::1}   Final diagnoses:  None    ED Discharge Orders     None

## 2024-09-10 NOTE — ED Notes (Signed)
 Ambulated pt , Oxygen 98% , HR 127 while ambulating

## 2024-09-10 NOTE — Discharge Instructions (Signed)
 You have suffered an asthma exacerbation.  I have given you the information for the [facility] health wellness clinic please follow-up with them.  If you have private of health insurance that is often faster to call and obtain a list of covered providers and follow-up with one of them.  Please take the prednisone that I prescribed you for the full course.  Please return to the emergency room for any episodes of passing out, more difficulty breathing, any chest pain, coughing up blood, any other new or concerning symptoms.  Otherwise please follow-up with your primary care provider but I have recommended you to.  Use the inhaler 2-4 puffs every 4 hours as needed for shortness of breath.  I anticipate that you will need to use this less frequently over the next 24 hours and hopefully eventually not need to use it anymore after 48 hours.

## 2024-09-10 NOTE — ED Notes (Signed)
EKG given to Dr. Zackowski  

## 2024-09-10 NOTE — ED Triage Notes (Signed)
 Patient brought in by [facility] from work with shortness of breath that started yesterday evening.

## 2024-10-04 ENCOUNTER — Other Ambulatory Visit: Payer: Self-pay | Admitting: Medical Genetics

## 2024-10-04 ENCOUNTER — Encounter: Payer: Self-pay | Admitting: *Deleted

## 2024-10-04 DIAGNOSIS — Z006 Encounter for examination for normal comparison and control in clinical research program: Secondary | ICD-10-CM
